# Patient Record
Sex: Female | Born: 1985 | Hispanic: Yes | Marital: Married | Smoking: Never smoker
Health system: Southern US, Community
[De-identification: ages and names within clinical notes are randomized; demographics above are authoritative.]

---

## 2004-12-22 ENCOUNTER — Emergency Department: Payer: Self-pay | Admitting: Emergency Medicine

## 2004-12-30 ENCOUNTER — Emergency Department: Payer: Self-pay | Admitting: Emergency Medicine

## 2005-06-15 ENCOUNTER — Observation Stay: Payer: Self-pay | Admitting: Obstetrics & Gynecology

## 2012-06-14 ENCOUNTER — Emergency Department: Payer: Self-pay | Admitting: Emergency Medicine

## 2016-07-29 ENCOUNTER — Ambulatory Visit
Admission: RE | Admit: 2016-07-29 | Discharge: 2016-07-29 | Disposition: A | Discharge status: Home / Self Care | Payer: Self-pay | Source: Ambulatory Visit | Attending: Nurse Practitioner | Admitting: Nurse Practitioner

## 2016-07-29 ENCOUNTER — Other Ambulatory Visit: Payer: Self-pay | Admitting: Nurse Practitioner

## 2016-07-29 ENCOUNTER — Other Ambulatory Visit: Payer: Self-pay

## 2016-07-29 DIAGNOSIS — Z3481 Encounter for supervision of other normal pregnancy, first trimester: Secondary | ICD-10-CM

## 2016-08-03 ENCOUNTER — Emergency Department: Payer: Self-pay

## 2016-08-03 ENCOUNTER — Emergency Department
Admission: EM | Admit: 2016-08-03 | Discharge: 2016-08-04 | Disposition: A | Discharge status: Home / Self Care | Payer: Self-pay | Attending: Emergency Medicine | Admitting: Emergency Medicine

## 2016-08-03 DIAGNOSIS — Z3A12 12 weeks gestation of pregnancy: Secondary | ICD-10-CM | POA: Insufficient documentation

## 2016-08-03 DIAGNOSIS — Z79899 Other long term (current) drug therapy: Secondary | ICD-10-CM | POA: Insufficient documentation

## 2016-08-03 DIAGNOSIS — O4692 Antepartum hemorrhage, unspecified, second trimester: Secondary | ICD-10-CM

## 2016-08-03 DIAGNOSIS — N9489 Other specified conditions associated with female genital organs and menstrual cycle: Secondary | ICD-10-CM | POA: Insufficient documentation

## 2016-08-03 DIAGNOSIS — O209 Hemorrhage in early pregnancy, unspecified: Secondary | ICD-10-CM | POA: Insufficient documentation

## 2016-08-03 LAB — URINALYSIS, COMPLETE (UACMP) WITH MICROSCOPIC
BILIRUBIN URINE: NEGATIVE
GLUCOSE, UA: NEGATIVE mg/dL
HGB URINE DIPSTICK: NEGATIVE
Ketones, ur: NEGATIVE mg/dL
LEUKOCYTES UA: NEGATIVE
NITRITE: POSITIVE — AB
PH: 7 (ref 5.0–8.0)
Protein, ur: NEGATIVE mg/dL
Specific Gravity, Urine: 1.006 (ref 1.005–1.030)

## 2016-08-03 LAB — CBC
HEMATOCRIT: 35.5 % (ref 35.0–47.0)
HEMOGLOBIN: 12.3 g/dL (ref 12.0–16.0)
MCH: 29.1 pg (ref 26.0–34.0)
MCHC: 34.7 g/dL (ref 32.0–36.0)
MCV: 83.9 fL (ref 80.0–100.0)
Platelets: 196 10*3/uL (ref 150–440)
RBC: 4.23 MIL/uL (ref 3.80–5.20)
RDW: 14.7 % — ABNORMAL HIGH (ref 11.5–14.5)
WBC: 9.7 10*3/uL (ref 3.6–11.0)

## 2016-08-03 LAB — COMPREHENSIVE METABOLIC PANEL
ALK PHOS: 73 U/L (ref 38–126)
ALT: 16 U/L (ref 14–54)
AST: 29 U/L (ref 15–41)
Albumin: 3.7 g/dL (ref 3.5–5.0)
Anion gap: 8 (ref 5–15)
BILIRUBIN TOTAL: 0.4 mg/dL (ref 0.3–1.2)
BUN: 7 mg/dL (ref 6–20)
CALCIUM: 9.2 mg/dL (ref 8.9–10.3)
CO2: 24 mmol/L (ref 22–32)
CREATININE: 0.59 mg/dL (ref 0.44–1.00)
Chloride: 103 mmol/L (ref 101–111)
GFR calc non Af Amer: >60 mL/min (ref >60)
GLUCOSE: 96 mg/dL (ref 65–99)
Potassium: 3.7 mmol/L (ref 3.5–5.1)
SODIUM: 135 mmol/L (ref 135–145)
TOTAL PROTEIN: 7.7 g/dL (ref 6.5–8.1)

## 2016-08-03 LAB — ABO/RH: ABO/RH(D): O POS

## 2016-08-03 LAB — HCG, QUANTITATIVE, PREGNANCY: HCG, BETA CHAIN, QUANT, S: 61680 m[IU]/mL — AB (ref <5)

## 2016-08-03 MED ORDER — SODIUM CHLORIDE 0.9 % IV BOLUS (SEPSIS)
1000.0000 mL | Freq: Once | INTRAVENOUS | Status: AC
  Administered 2016-08-04: 1000 mL via INTRAVENOUS

## 2016-08-03 NOTE — Discharge Instructions (Signed)
Please follow up closely with obstetrics and gynecology or health department doctor.  Return to the emergency room if your bleeding worsens, you become weak and dizzy or lightheaded, you have a fever or severe pain in the back or abdomen, you have an episode of passing out, develop severe bleeding such as more than 1 soaked pad per hour for more than 3 straight hours, develop abdominal or pelvic pain, fevers chills or other new concerns arise.

## 2016-08-03 NOTE — ED Notes (Signed)
Pelvic cramping intermittently since she found out that she was pregnant. Radiating to both sides of lower back. Pt reports scant vaginal bleeding today. Pt alert and oriented X4, active, cooperative, pt in NAD. RR even and unlabored, color WNL.

## 2016-08-03 NOTE — ED Triage Notes (Signed)
Pt is 12-[redacted] weeks pregnant and vomiting every day - today she started with vaginal bleeding (spotting) and lower abd pain that radiates into back

## 2016-08-03 NOTE — ED Notes (Signed)
Pt in ultrasound

## 2016-08-04 MED ORDER — DOXYLAMINE-PYRIDOXINE 10-10 MG PO TBEC: 2.0000 | DELAYED_RELEASE_TABLET | Freq: Every day | ORAL | 0 refills | Status: AC

## 2016-08-04 NOTE — ED Provider Notes (Signed)
[facility] Emergency Department Provider Note   ____________________________________________   First MD Initiated Contact with Patient 08/03/16 2324     (approximate)  I have reviewed the triage vital signs and the nursing notes.   HISTORY  Chief Complaint Vaginal Bleeding    HPI Diana Woodward is a 31 y.o. female here for evaluation of intermittent crampy lower abdominal pain beginning early this afternoon.  Patient reports she is about 12-[redacted] weeks pregnant, had an ultrasound a week ago by [facility] and was told to put normal, and follows with the local health department  This evening she began having intermittent crampy lower abdominal discomfort, which has now gone away, but noticed a small amount of vaginal bleeding associated. No other discharge or pain. She has had a slight increased frequency of urination over the last week, and was given a prescription for Macrobid to pick up today, but she has not yet completed her started this course.  She does report daily nausea and vomiting which has been true throughout the course of her pregnancy, currently on prescription Phenergan.  Today she continued to have her normal vomiting this morning, but had slightly more than normal amount throughout the rest of the day.  No fevers. No chills. No persistent pain in the lower pelvis or abdomen. She has been pregnant 3 times previous, with one C-section. No complications. No fertility factors. No history of ectopic  History reviewed. No pertinent past medical history.  There are no active problems to display for this patient.   History reviewed. No pertinent surgical history.  Prior to Admission medications   Medication Sig Start Date End Date Taking? Authorizing Provider  Doxylamine-Pyridoxine (DICLEGIS) 10-10 MG TBEC Take 2 tablets by mouth at bedtime. 08/04/16   Sharyn Creamer, MD    Allergies Patient has no known allergies.  No family history on  file.  Social History Social History  Substance Use Topics  . Smoking status: Never Smoker  . Smokeless tobacco: Never Used  . Alcohol use No    Review of Systems Constitutional: No fever/chills Eyes: No visual changes. ENT: No sore throat. Cardiovascular: Denies chest pain. Respiratory: Denies shortness of breath. Gastrointestinal: No abdominal pain but reports crampy discomfort in the lower pelvis midline, and a slight radiation into her lower back off and on since this afternoon.    No diarrhea.  No constipation. Genitourinary: Negative for dysuria. Has had increased urinary frequency over the last week and a half, reports the health department told her to decrease caffeine use, and also gave her a prescription for an antibiotic which she has not yet started today Musculoskeletal: Negative for back pain. Skin: Negative for rash. Neurological: Negative for headaches, focal weakness or numbness.  10-point ROS otherwise negative.  ____________________________________________   PHYSICAL EXAM:  VITAL SIGNS: ED Triage Vitals  Enc Vitals Group     BP 08/03/16 2128 121/73     Pulse Rate 08/03/16 2128 85     Resp 08/03/16 2128 15     Temp 08/03/16 2128 97 F (36.1 C)     Temp Source 08/03/16 2128 Oral     SpO2 08/03/16 2128 98 %     Weight 08/03/16 2129 167 lb (75.8 kg)     Height 08/03/16 2129 5\' 4"  (1.626 m)     Head Circumference --      Peak Flow --      Pain Score 08/03/16 2129 5     Pain Loc --  Pain Edu? --      Excl. in GC? --    Spanish interpreter utilized throughout Training and development officer(Hiram) gathering clinical history and examination  Constitutional: Alert and oriented. Well appearing and in no acute distress. Patient and her friend who accompanied her both extremely pleasant. Eyes: Conjunctivae are normal. PERRL. EOMI. Head: Atraumatic. Nose: No congestion/rhinnorhea. Mouth/Throat: Mucous membranes are moist.  Oropharynx non-erythematous. Neck: No stridor.    Cardiovascular: Normal rate, regular rhythm. Grossly normal heart sounds.  Good peripheral circulation. Respiratory: Normal respiratory effort.  No retractions. Lungs CTAB. Gastrointestinal: Soft and nontender. No distention. No abdominal bruits. No CVA tenderness. No pain on palpation over the pelvic or suprapubic region at this time. Of note the patient reports her symptoms seemed to have gone away, no further bleeding. Does feel gravid, uterine fundus palpated 4-5 cm above the pelvic brim. No focal pain in the right lower quadrant. Negative Murphy Musculoskeletal: No lower extremity tenderness nor edema.   Neurologic:  Normal speech and language. No gross focal neurologic deficits are appreciated. Skin:  Skin is warm, dry and intact. No rash noted. Psychiatric: Mood and affect are normal. Speech and behavior are normal.  ____________________________________________   LABS (all labs ordered are listed, but only abnormal results are displayed)  Labs Reviewed  URINALYSIS, COMPLETE (UACMP) WITH MICROSCOPIC - Abnormal; Notable for the following:       Result Value   Color, Urine YELLOW (*)    APPearance CLOUDY (*)    Nitrite POSITIVE (*)    Bacteria, UA MANY (*)    Squamous Epithelial / LPF 6-30 (*)    All other components within normal limits  HCG, QUANTITATIVE, PREGNANCY - Abnormal; Notable for the following:    hCG, Beta Chain, Quant, S 61,680 (*)    All other components within normal limits  CBC - Abnormal; Notable for the following:    RDW 14.7 (*)    All other components within normal limits  URINE CULTURE  COMPREHENSIVE METABOLIC PANEL  ABO/RH   ____________________________________________  EKG   ____________________________________________  RADIOLOGY  [place] Ob Comp Less 14 Wks  Result Date: 08/03/2016 CLINICAL DATA:  Light vaginal bleeding for 1 day. Positive urine pregnancy test. Estimated gestational age by LMP is 14 weeks 0 days. EXAM: OBSTETRIC <14 WK ULTRASOUND  TECHNIQUE: Transabdominal ultrasound was performed for evaluation of the gestation as well as the maternal uterus and adnexal regions. COMPARISON:  None. FINDINGS: Intrauterine gestational sac: A single intrauterine pregnancy is identified. Yolk sac: Yolk sac is not identified consistent with gestational age. Embryo:  Fetal pole is present.  Fetal motion is observed. Cardiac Activity: Fetal cardiac activity is observed. Heart Rate: 163 bpm CRL:   65.6  mm   12 w 6 d                  US EDC: 02/09/2017 Subchorionic hemorrhage:  None visualized. Maternal uterus/adnexae: Limited visualization of the uterus. Ovaries are not visualized. No free fluid in the pelvis. IMPRESSION: Single intrauterine pregnancy. Estimated gestational age by crown-rump length is 12 weeks 6 days. No acute complication is identified sonographically. Electronically Signed   By: Burman NievesWilliam  Stevens M.D.   On: 08/03/2016 23:29    ____________________________________________   PROCEDURES  Procedure(s) performed: None  Procedures  Critical Care performed: No  ____________________________________________   INITIAL IMPRESSION / ASSESSMENT AND PLAN / ED COURSE  Pertinent labs & imaging results that were available during my care of the patient were reviewed by me and considered in  my medical decision making (see chart for details).  Patient presents for crampy lower abdominal discomfort and small amount of vaginal bleeding. Hemodynamically stable. Known pregnancy, just entering the second trimester. No associated risk factors for ectopic, clinically very reassuring examination with resolution of pain and symptomatology at this time. She is on Phenergan, and reports persistent daily nausea and vomiting that appears most consistent with morning sickness. I did discuss, and she is amenable to also trying to Diclegis which I will prescribe a short course for.  Her ultrasound is reassuring, her clinical symptoms have improved. I did  advise her that we will culture her urine, but a would recommend she start the antibiotic for a possible urinary tract infection which her doctor has prescribed her. Patient is agreeable with this plan  Careful return precautions are discussed with the patient, also her friend, and she is agreeable. All questions, and follow-up plan as well as return precautions were discussed with the patient via use of Spanish interpreter.      ____________________________________________   FINAL CLINICAL IMPRESSION(S) / ED DIAGNOSES  Final diagnoses:  Vaginal bleeding in pregnancy, second trimester  pregnancy related nausea and vomiting Morning sickness    NEW MEDICATIONS STARTED DURING THIS VISIT:  New Prescriptions   DOXYLAMINE-PYRIDOXINE (DICLEGIS) 10-10 MG TBEC    Take 2 tablets by mouth at bedtime.     Note:  This document was prepared using Dragon voice recognition software and may include unintentional dictation errors.     Sharyn Creamer, MD 08/04/16 [phone]

## 2016-08-04 NOTE — ED Notes (Addendum)
Pt alert and oriented X4, active, cooperative, pt in NAD. RR even and unlabored, color WNL.  Pt informed to return if any life threatening symptoms occur.   [employer] interpreter used for discharge.  

## 2016-08-04 NOTE — ED Notes (Signed)
Pt awaiting IV fluids to be finished and will then be discharged to home.

## 2016-08-06 LAB — URINE CULTURE: SPECIAL REQUESTS: NORMAL

## 2017-09-18 ENCOUNTER — Encounter: Payer: Self-pay | Admitting: Emergency Medicine

## 2017-09-18 ENCOUNTER — Other Ambulatory Visit: Payer: Self-pay

## 2017-09-18 DIAGNOSIS — N12 Tubulo-interstitial nephritis, not specified as acute or chronic: Secondary | ICD-10-CM | POA: Insufficient documentation

## 2017-09-18 DIAGNOSIS — R109 Unspecified abdominal pain: Secondary | ICD-10-CM | POA: Insufficient documentation

## 2017-09-18 DIAGNOSIS — M549 Dorsalgia, unspecified: Secondary | ICD-10-CM | POA: Insufficient documentation

## 2017-09-18 DIAGNOSIS — R112 Nausea with vomiting, unspecified: Secondary | ICD-10-CM | POA: Insufficient documentation

## 2017-09-18 LAB — COMPREHENSIVE METABOLIC PANEL
ALT: 50 U/L (ref 14–54)
AST: 42 U/L — AB (ref 15–41)
Albumin: 4.3 g/dL (ref 3.5–5.0)
Alkaline Phosphatase: 101 U/L (ref 38–126)
Anion gap: 9 (ref 5–15)
BUN: 6 mg/dL (ref 6–20)
CHLORIDE: 105 mmol/L (ref 101–111)
CO2: 22 mmol/L (ref 22–32)
CREATININE: 0.49 mg/dL (ref 0.44–1.00)
Calcium: 8.9 mg/dL (ref 8.9–10.3)
GFR calc Af Amer: >60 mL/min (ref >60)
GFR calc non Af Amer: >60 mL/min (ref >60)
Glucose, Bld: 110 mg/dL — ABNORMAL HIGH (ref 65–99)
Potassium: 3.6 mmol/L (ref 3.5–5.1)
SODIUM: 136 mmol/L (ref 135–145)
Total Bilirubin: 0.7 mg/dL (ref 0.3–1.2)
Total Protein: 8.7 g/dL — ABNORMAL HIGH (ref 6.5–8.1)

## 2017-09-18 LAB — CBC
HCT: 39.7 % (ref 35.0–47.0)
Hemoglobin: 12.8 g/dL (ref 12.0–16.0)
MCH: 25.8 pg — AB (ref 26.0–34.0)
MCHC: 32.1 g/dL (ref 32.0–36.0)
MCV: 80.3 fL (ref 80.0–100.0)
PLATELETS: 160 10*3/uL (ref 150–440)
RBC: 4.95 MIL/uL (ref 3.80–5.20)
RDW: 16.7 % — AB (ref 11.5–14.5)
WBC: 13.5 10*3/uL — ABNORMAL HIGH (ref 3.6–11.0)

## 2017-09-18 LAB — LIPASE, BLOOD: LIPASE: 24 U/L (ref 11–51)

## 2017-09-18 NOTE — ED Notes (Signed)
Patient to stat desk asking about wait time. Patient given update on wait time. Patient verbalizes understanding. Patient in no acute distress at this time.  

## 2017-09-18 NOTE — ED Triage Notes (Signed)
Pt to ED via POV c/o RLQ abd pain and back pain that started last night. Pt states that the pain has gotten worse today. Pt is in NAD at this time. Pt denies fever. Pt states that she has been vomiting, has vomited 3 times in the last 24 hours.

## 2017-09-19 ENCOUNTER — Emergency Department: Payer: Self-pay

## 2017-09-19 ENCOUNTER — Emergency Department
Admission: EM | Admit: 2017-09-19 | Discharge: 2017-09-19 | Disposition: A | Discharge status: Home / Self Care | Payer: Self-pay | Attending: Emergency Medicine | Admitting: Emergency Medicine

## 2017-09-19 DIAGNOSIS — N12 Tubulo-interstitial nephritis, not specified as acute or chronic: Secondary | ICD-10-CM

## 2017-09-19 DIAGNOSIS — R109 Unspecified abdominal pain: Secondary | ICD-10-CM

## 2017-09-19 DIAGNOSIS — R112 Nausea with vomiting, unspecified: Secondary | ICD-10-CM

## 2017-09-19 LAB — URINALYSIS, COMPLETE (UACMP) WITH MICROSCOPIC
Bilirubin Urine: NEGATIVE
Glucose, UA: NEGATIVE mg/dL
KETONES UR: NEGATIVE mg/dL
Nitrite: POSITIVE — AB
PH: 6 (ref 5.0–8.0)
Protein, ur: 30 mg/dL — AB
SPECIFIC GRAVITY, URINE: 1.004 — AB (ref 1.005–1.030)

## 2017-09-19 LAB — POCT PREGNANCY, URINE: PREG TEST UR: NEGATIVE

## 2017-09-19 MED ORDER — MORPHINE SULFATE (PF) 4 MG/ML IV SOLN
4.0000 mg | Freq: Once | INTRAVENOUS | Status: AC
  Administered 2017-09-19: 4 mg via INTRAVENOUS
  Filled 2017-09-19: qty 1

## 2017-09-19 MED ORDER — ONDANSETRON HCL 4 MG/2ML IJ SOLN: 4.0000 mg | Freq: Once | INTRAMUSCULAR | Status: DC

## 2017-09-19 MED ORDER — ONDANSETRON HCL 4 MG/2ML IJ SOLN
4.0000 mg | Freq: Once | INTRAMUSCULAR | Status: AC
  Administered 2017-09-19: 4 mg via INTRAVENOUS

## 2017-09-19 MED ORDER — MORPHINE SULFATE (PF) 4 MG/ML IV SOLN
4.0000 mg | Freq: Once | INTRAVENOUS | Status: AC
  Administered 2017-09-19: 4 mg via INTRAVENOUS

## 2017-09-19 MED ORDER — MORPHINE SULFATE (PF) 4 MG/ML IV SOLN
INTRAVENOUS | Status: AC
  Administered 2017-09-19: 4 mg via INTRAVENOUS
  Filled 2017-09-19: qty 1

## 2017-09-19 MED ORDER — SODIUM CHLORIDE 0.9 % IV SOLN
1.0000 g | Freq: Once | INTRAVENOUS | Status: AC
  Administered 2017-09-19: 1 g via INTRAVENOUS
  Filled 2017-09-19: qty 10

## 2017-09-19 MED ORDER — OXYCODONE-ACETAMINOPHEN 7.5-325 MG PO TABS: 1.0000 | ORAL_TABLET | ORAL | 0 refills | Status: AC | PRN

## 2017-09-19 MED ORDER — MORPHINE SULFATE (PF) 4 MG/ML IV SOLN: 4.0000 mg | Freq: Once | INTRAVENOUS | Status: DC

## 2017-09-19 MED ORDER — ONDANSETRON HCL 4 MG/2ML IJ SOLN
INTRAMUSCULAR | Status: AC
  Administered 2017-09-19: 4 mg via INTRAVENOUS
  Filled 2017-09-19: qty 2

## 2017-09-19 MED ORDER — CEPHALEXIN 500 MG PO CAPS: 500.0000 mg | ORAL_CAPSULE | Freq: Four times a day (QID) | ORAL | 0 refills | Status: AC

## 2017-09-19 MED ORDER — SODIUM CHLORIDE 0.9 % IV BOLUS (SEPSIS)
1000.0000 mL | Freq: Once | INTRAVENOUS | Status: AC
  Administered 2017-09-19: 1000 mL via INTRAVENOUS

## 2017-09-19 MED ORDER — ONDANSETRON 4 MG PO TBDP: 4.0000 mg | ORAL_TABLET | Freq: Three times a day (TID) | ORAL | 0 refills | Status: AC | PRN

## 2017-09-19 NOTE — ED Notes (Signed)
Report to laurie, rn.  

## 2017-09-19 NOTE — ED Notes (Addendum)
Pt using call bell; wondering when the MD will be back in; pt's antibiotic still has over a half to infuse, pt keeps bending her arm; understands medication can't infuse that way; explained MD would be in to follow up when medicine is finished; pt tolerated po fluids given earlier without difficulty;

## 2017-09-19 NOTE — ED Notes (Signed)
Discharging pt home with [facility], [employer] interpreter [phone] [facility]

## 2017-09-19 NOTE — ED Provider Notes (Signed)
[facility] Emergency Department Provider Note   ____________________________________________   First MD Initiated Contact with Patient 09/19/17 0101     (approximate)  I have reviewed the triage vital signs and the nursing notes.   HISTORY  Chief Complaint Abdominal Pain and Back Pain    HPI Diana Woodward is a 32 y.o. female who comes into the hospital today with some abdomen pain and back pain.  The patient states that the pain started tonight.  She denies any pain with urination but has had some vomiting since about 3 PM.  The patient has not been able to eat anything since yesterday.  She is been taking Tylenol for the pain but states that it did not help.  The patient rates her pain currently a 10 out of 10 in intensity.  She states that the pain is more severe than when she had her last baby and had to get multiple epidurals.  The patient did not take her temperature but feels as though she had a fever.  She could not rest because she was so uncomfortable.  The pain is on the left side of her back and wraps around to her abdomen.  The patient states that she vomited about 4 times and it was mainly water.  She is here today for evaluation.   History reviewed. No pertinent past medical history.  There are no active problems to display for this patient.   Past Surgical History:  Procedure Laterality Date  . CESAREAN SECTION     x 2    Prior to Admission medications   Medication Sig Start Date End Date Taking? Authorizing Provider  cephALEXin (KEFLEX) 500 MG capsule Take 1 capsule (500 mg total) by mouth 4 (four) times daily for 10 days. 09/19/17 09/29/17  Rebecka Apley, MD  Doxylamine-Pyridoxine (DICLEGIS) 10-10 MG TBEC Take 2 tablets by mouth at bedtime. 08/04/16   Sharyn Creamer, MD  ondansetron (ZOFRAN ODT) 4 MG disintegrating tablet Take 1 tablet (4 mg total) by mouth every 8 (eight) hours as needed for nausea or vomiting. 09/19/17   Rebecka Apley, MD  oxyCODONE-acetaminophen (PERCOCET) 7.5-325 MG tablet Take 1 tablet by mouth every 4 (four) hours as needed for severe pain. 09/19/17 09/19/18  Rebecka Apley, MD    Allergies Patient has no known allergies.  No family history on file.  Social History Social History   Tobacco Use  . Smoking status: Never Smoker  . Smokeless tobacco: Never Used  Substance Use Topics  . Alcohol use: No  . Drug use: No    Review of Systems  Constitutional:  fever/chills Eyes: No visual changes. ENT: No sore throat. Cardiovascular: Denies chest pain. Respiratory: Denies shortness of breath. Gastrointestinal:  abdominal pain, nausea, vomiting.  No diarrhea.  No constipation. Genitourinary: Negative for dysuria. Musculoskeletal:  back pain. Skin: Negative for rash. Neurological: Negative for headaches, focal weakness or numbness.   ____________________________________________   PHYSICAL EXAM:  VITAL SIGNS: ED Triage Vitals  Enc Vitals Group     BP 09/18/17 1814 124/61     Pulse Rate 09/18/17 1814 99     Resp 09/18/17 1814 20     Temp 09/18/17 1814 98.2 F (36.8 C)     Temp Source 09/18/17 1814 Oral     SpO2 09/18/17 1814 97 %     Weight 09/18/17 1817 149 lb (67.6 kg)     Height --      Head Circumference --  Peak Flow --      Pain Score 09/18/17 1820 6     Pain Loc --      Pain Edu? --      Excl. in GC? --     Constitutional: Alert and oriented. Well appearing and in moderate to severe distress. Eyes: Conjunctivae are normal. PERRL. EOMI. Head: Atraumatic. Nose: No congestion/rhinnorhea. Mouth/Throat: Mucous membranes are moist.  Oropharynx non-erythematous. Cardiovascular: Normal rate, regular rhythm. Grossly normal heart sounds.  Good peripheral circulation. Respiratory: Normal respiratory effort.  No retractions. Lungs CTAB. Gastrointestinal: Soft with some left lower abdominal tenderness to palpation. No distention. Positive bowel sounds, with some  left CVA tenderness to palpation Musculoskeletal: No lower extremity tenderness nor edema.  Neurologic:  Normal speech and language.  Skin:  Skin is warm, dry and intact.  Psychiatric: Mood and affect are normal.   ____________________________________________   LABS (all labs ordered are listed, but only abnormal results are displayed)  Labs Reviewed  COMPREHENSIVE METABOLIC PANEL - Abnormal; Notable for the following components:      Result Value   Glucose, Bld 110 (*)    Total Protein 8.7 (*)    AST 42 (*)    All other components within normal limits  CBC - Abnormal; Notable for the following components:   WBC 13.5 (*)    MCH 25.8 (*)    RDW 16.7 (*)    All other components within normal limits  URINALYSIS, COMPLETE (UACMP) WITH MICROSCOPIC - Abnormal; Notable for the following components:   Color, Urine YELLOW (*)    APPearance CLOUDY (*)    Specific Gravity, Urine 1.004 (*)    Hgb urine dipstick MODERATE (*)    Protein, ur 30 (*)    Nitrite POSITIVE (*)    Leukocytes, UA LARGE (*)    Bacteria, UA RARE (*)    Squamous Epithelial / LPF 0-5 (*)    All other components within normal limits  CULTURE, BLOOD (ROUTINE X 2)  CULTURE, BLOOD (ROUTINE X 2)  LIPASE, BLOOD  POC URINE PREG, ED  POCT PREGNANCY, URINE   ____________________________________________  EKG  none ____________________________________________  RADIOLOGY  ED MD interpretation:  CT renal stone study: Findings consistent with cystitis and right pyelonephritis, no urolithiasis  Official radiology report(s): Ct Renal Stone Study  Result Date: 09/19/2017 CLINICAL DATA:  Flank pain.  Vomiting. EXAM: CT ABDOMEN AND PELVIS WITHOUT CONTRAST TECHNIQUE: Multidetector CT imaging of the abdomen and pelvis was performed following the standard protocol without IV contrast. COMPARISON:  None. FINDINGS: Lower chest: The lung bases are clear. Hepatobiliary: Liver is enlarged with hepatic steatosis. Minimal sparing  adjacent gallbladder fossa. Tiny 5 mm hypodensity in the right dome of the liver. Gallbladder physiologically distended, no calcified stone. No biliary dilatation. Pancreas: No ductal dilatation or inflammation. Spleen: Normal in size without focal abnormality. Adrenals/Urinary Tract: Normal adrenal glands. Mild right hydroureteronephrosis. No urolithiasis. There is mild right perinephric and periureteric edema. Punctate calcification in the right pelvis is posterior to the ureter and is likely a phlebolith. There is diffuse urinary bladder wall thickening. Mild perivesicular edema. No left hydronephrosis or hydroureter. No stone in either kidney. Stomach/Bowel: Stomach is within normal limits. Appendix appears normal. No evidence of bowel wall thickening, distention, or inflammatory changes. Moderate colonic stool with mild sigmoid tortuosity. Vascular/Lymphatic: Normal course and caliber of abdominal vessels. No enlarged abdominal or pelvic lymph nodes. Reproductive: Uterus and bilateral adnexa are unremarkable. Other: No free air, free fluid, or intra-abdominal fluid collection. Small  fat containing umbilical hernia. Musculoskeletal: There are no acute or suspicious osseous abnormalities. IMPRESSION: 1. Findings consistent with cystitis and right pyelonephritis. No urolithiasis. 2. Hepatic steatosis. Electronically Signed   By: Rubye Oaks M.D.   On: 09/19/2017 03:22    ____________________________________________   PROCEDURES  Procedure(s) performed: None  Procedures  Critical Care performed: No  ____________________________________________   INITIAL IMPRESSION / ASSESSMENT AND PLAN / ED COURSE  As part of my medical decision making, I reviewed the following data within the electronic MEDICAL RECORD NUMBER Notes from prior ED visits and [place] Controlled Substance Database   This is a 32 year old female who comes into the hospital today with some left-sided pain in her left abdomen and left  flank.  My differential diagnosis includes kidney stone, urine infection, musculoskeletal pain  I did give the patient a dose of morphine and Zofran.  I sent the patient for a CT scan looking for kidney stones.  The patient was still having pain so I gave her another dose of morphine and Zofran.  Her urinalysis did come back and it showed that the patient has a urinary tract infection with positive nitrites large leukocyte esterase and too numerous to count white blood cells.  The patient's white blood cell count is 13.5 but her CMP is negative.  I did order some ceftriaxone for the patient and she did receive Zofran.  After receiving her IV medication the patient was able to drink without any vomiting.  She states that her pain is improved but has not gone away completely.  I did explain to the patient that the pain would not go away completely.  The patient feels well enough to be discharged home.  She will be discharged to follow-up with the acute care clinic.  The patient should return with any worsening condition or any other concerns.      ____________________________________________   FINAL CLINICAL IMPRESSION(S) / ED DIAGNOSES  Final diagnoses:  Flank pain  Pyelonephritis  Non-intractable vomiting with nausea, unspecified vomiting type     ED Discharge Orders        Ordered    cephALEXin (KEFLEX) 500 MG capsule  4 times daily     09/19/17 0602    ondansetron (ZOFRAN ODT) 4 MG disintegrating tablet  Every 8 hours PRN     09/19/17 0602    oxyCODONE-acetaminophen (PERCOCET) 7.5-325 MG tablet  Every 4 hours PRN     09/19/17 0602       Note:  This document was prepared using Dragon voice recognition software and may include unintentional dictation errors.    Rebecka Apley, MD 09/19/17 [phone]

## 2017-09-19 NOTE — ED Notes (Signed)
Pt provided with water per dr. Zenda AlpersWebster for po challenge.

## 2017-09-19 NOTE — ED Notes (Signed)
Pt states onset of low back pain that radiates to llq 2 daya pta. Pt states she has had a fever, unknown level. Pt is thrashing around in pain on bed. Skin normal color, warm and dry. Pt denies vaginal discharge. Pt denies known hematuria. Pt moving all extremities without difficulty.

## 2017-09-19 NOTE — ED Notes (Signed)
Patient transported to CT 

## 2017-09-19 NOTE — ED Notes (Signed)
Dr Zenda AlpersWebster in to follow up

## 2017-09-19 NOTE — ED Notes (Signed)
Pt back from CT

## 2017-09-19 NOTE — ED Notes (Signed)
Temperature adjusted in room for pt comfort. Pt encouraged to leave arm straight to promote antibiotic administration.

## 2017-09-19 NOTE — ED Notes (Signed)
Family to stat desk asking about wait time. Patient given update on wait time. Patient verbalizes understanding.

## 2017-09-24 LAB — CULTURE, BLOOD (ROUTINE X 2)
Culture: NO GROWTH
Culture: NO GROWTH

## 2018-02-21 ENCOUNTER — Other Ambulatory Visit: Payer: Self-pay

## 2018-02-21 ENCOUNTER — Emergency Department
Admission: EM | Admit: 2018-02-21 | Discharge: 2018-02-21 | Disposition: A | Discharge status: Home / Self Care | Payer: Self-pay | Attending: Emergency Medicine | Admitting: Emergency Medicine

## 2018-02-21 DIAGNOSIS — H60502 Unspecified acute noninfective otitis externa, left ear: Secondary | ICD-10-CM | POA: Insufficient documentation

## 2018-02-21 DIAGNOSIS — Z79899 Other long term (current) drug therapy: Secondary | ICD-10-CM | POA: Insufficient documentation

## 2018-02-21 MED ORDER — CIPROFLOXACIN HCL 0.3 % OP SOLN: 2.0000 [drp] | OPHTHALMIC | 0 refills | Status: AC

## 2018-02-21 MED ORDER — CIPROFLOXACIN HCL 0.3 % OP SOLN
2.0000 [drp] | Freq: Once | OPHTHALMIC | Status: AC
  Administered 2018-02-21: 2 [drp] via OTIC
  Filled 2018-02-21: qty 2.5

## 2018-02-21 MED ORDER — ACETAMINOPHEN-CODEINE #3 300-30 MG PO TABS: 1.0000 | ORAL_TABLET | Freq: Three times a day (TID) | ORAL | 0 refills | Status: AC | PRN

## 2018-02-21 MED ORDER — HYDROCODONE-ACETAMINOPHEN 5-325 MG PO TABS
1.0000 | ORAL_TABLET | Freq: Once | ORAL | Status: AC
  Administered 2018-02-21: 1 via ORAL
  Filled 2018-02-21: qty 1

## 2018-02-21 NOTE — ED Notes (Signed)
See triage note  Presents with left ear pain and subjective fever for couple of days   Low grade fever on arrival

## 2018-02-21 NOTE — ED Triage Notes (Signed)
L earache x 3 days.  

## 2018-02-21 NOTE — Discharge Instructions (Signed)
You have an infection to the ear canal. Use the ear drops as directed. Take the pain medicine as needed. Do NOT drive after taking the pain medicine.  Avoid getting any water in the ear when showering. The wick will fall out of the ear when the swelling improves. Follow-up with Dr. Elenore RotaJuengel as needed.   Tiene una infeccin en el canal auditivo. Use las gotas para los odos segn las indicaciones. Tome el medicamento para el dolor segn sea necesario. NO maneje despus de tomar la medicina para [employer] dolor. Evite que entre agua en el odo cuando se bae. La mecha se caer del odo cuando mejore la hinchazn. Seguimiento con el Dr. Elenore RotaJuengel segn sea necesario.

## 2018-02-22 NOTE — ED Provider Notes (Signed)
[facility] Emergency Department Provider Note ____________________________________________  Time seen: [id number]  I have reviewed the triage vital signs and the nursing notes.  HISTORY  Chief Complaint  Otalgia  History limited by Spanish language.  Interpreter present for interview and exam.  HPI Diana Woodward is a 32 y.o. female presents to the ED accompanied by family, for evaluation of severe left ear pain.  Reports ear pain for the last 3 days but denies any otorrhea.  She does report some significant pain with movement of the earlobe as well as some decreased hearing.  She denies any water intrusion into the ear, barotrauma, or other injury.  Denies any nausea, vomiting, or dizziness not had any relief with over-the-counter pain medicines.  No past medical history on file.  There are no active problems to display for this patient.  Past Surgical History:  Procedure Laterality Date  . CESAREAN SECTION     x 2    Prior to Admission medications   Medication Sig Start Date End Date Taking? Authorizing Provider  acetaminophen-codeine (TYLENOL #3) 300-30 MG tablet Take 1 tablet by mouth every 8 (eight) hours as needed for up to 3 days for moderate pain. 02/21/18 02/24/18  Maribel Luis, Charlesetta IvoryJenise V Bacon, PA-C  ciprofloxacin (CILOXAN) 0.3 % ophthalmic solution Place 2 drops into the left ear every 4 (four) hours while awake for 7 days. 02/21/18 02/28/18  Shabree Tebbetts, Charlesetta IvoryJenise V Bacon, PA-C  Doxylamine-Pyridoxine (DICLEGIS) 10-10 MG TBEC Take 2 tablets by mouth at bedtime. 08/04/16   Sharyn CreamerQuale, Mark, MD  ondansetron (ZOFRAN ODT) 4 MG disintegrating tablet Take 1 tablet (4 mg total) by mouth every 8 (eight) hours as needed for nausea or vomiting. 09/19/17   Rebecka ApleyWebster, Allison P, MD  oxyCODONE-acetaminophen (PERCOCET) 7.5-325 MG tablet Take 1 tablet by mouth every 4 (four) hours as needed for severe pain. 09/19/17 09/19/18  Rebecka ApleyWebster, Allison P, MD    Allergies Patient has no known  allergies.  No family history on file.  Social History Social History   Tobacco Use  . Smoking status: Never Smoker  . Smokeless tobacco: Never Used  Substance Use Topics  . Alcohol use: No  . Drug use: No    Review of Systems  Constitutional: Negative for fever. Eyes: Negative for visual changes. ENT: Negative for sore throat.  Left otalgia as above. Cardiovascular: Negative for chest pain. Respiratory: Negative for shortness of breath. Gastrointestinal: Negative for abdominal pain, vomiting and diarrhea. Musculoskeletal: Negative for back pain. Skin: Negative for rash. Neurological: Negative for headaches, focal weakness or numbness. ____________________________________________  PHYSICAL EXAM:  VITAL SIGNS: ED Triage Vitals  Enc Vitals Group     BP 02/21/18 0907 132/83     Pulse Rate 02/21/18 0907 94     Resp 02/21/18 0907 18     Temp 02/21/18 0907 99.2 F (37.3 C)     Temp Source 02/21/18 0907 Oral     SpO2 02/21/18 0907 98 %     Weight 02/21/18 0908 190 lb (86.2 kg)     Height 02/21/18 0908 5\' 6"  (1.676 m)     Head Circumference --      Peak Flow --      Pain Score 02/21/18 0907 8     Pain Loc --      Pain Edu? --      Excl. in GC? --     Constitutional: Alert and oriented. Well appearing and in no distress. Head: Normocephalic and atraumatic. Eyes: Conjunctivae are normal.  PERRL. Normal extraocular movements Ears: Canal on the right is clear. TMs intact on the right. The left ear canal is edematous and has a purulent discharge. The tragus is tender and the TM is not visualized due to patient discomfort. Cardiovascular: Normal rate, regular rhythm. Normal distal pulses. Respiratory: Normal respiratory effort. No wheezes/rales/rhonchi. Musculoskeletal: Nontender with normal range of motion in all extremities.  Neurologic:  Normal gait without ataxia. Normal speech and language. No gross focal neurologic deficits are appreciated. Skin:  Skin is warm, dry  and intact. No rash noted. Psychiatric: Mood and affect are normal. Patient exhibits appropriate insight and judgment. ____________________________________________  PROCEDURES  Procedures  Norco 5-325 mgPO Ear wick placed in the left ear Ii gtts ciprofloxacin ophthalmic ointment to the left ear ____________________________________________  INITIAL IMPRESSION / ASSESSMENT AND PLAN / ED COURSE  Patient with ED evaluation of 3 days of severe left ear pain with relation of the tragus.  Patient's exam is concerning for an otitis externa.  Ear wick is placed and eardrops are instilled.  She is discharged with a prescription for Tylenol No. 3 for severe pain relief.  The ciprofloxacin ophthalmic ointment is dispensed here in the ED for use in the ear.  She will follow-up with ENT for ongoing management.  I reviewed the patient's prescription history over the last 12 months in the multi-state controlled substances database(s) that includes [place], [place], [place], [place], [place], [place], [place], [place], [place], [place], [place], [place], [place], and [place].  Results were notable for no current prescriptions.  ____________________________________________  FINAL CLINICAL IMPRESSION(S) / ED DIAGNOSES  Final diagnoses:  Acute otitis externa of left ear, unspecified type      Lissa Hoard, PA-C 02/22/18 1924    Myrna Blazer, MD 02/22/18 2204

## 2018-05-06 ENCOUNTER — Emergency Department
Admission: EM | Admit: 2018-05-06 | Discharge: 2018-05-06 | Disposition: A | Discharge status: Home / Self Care | Payer: Self-pay | Attending: Emergency Medicine | Admitting: Emergency Medicine

## 2018-05-06 DIAGNOSIS — H60333 Swimmer's ear, bilateral: Secondary | ICD-10-CM | POA: Insufficient documentation

## 2018-05-06 DIAGNOSIS — Z79899 Other long term (current) drug therapy: Secondary | ICD-10-CM | POA: Insufficient documentation

## 2018-05-06 MED ORDER — CIPROFLOXACIN-DEXAMETHASONE 0.3-0.1 % OT SUSP
4.0000 [drp] | Freq: Once | OTIC | Status: AC
  Administered 2018-05-06: 4 [drp] via OTIC
  Filled 2018-05-06: qty 7.5

## 2018-05-06 MED ORDER — KETOROLAC TROMETHAMINE 30 MG/ML IJ SOLN
30.0000 mg | Freq: Once | INTRAMUSCULAR | Status: AC
  Administered 2018-05-06: 30 mg via INTRAMUSCULAR
  Filled 2018-05-06: qty 1

## 2018-05-06 NOTE — ED Notes (Signed)
Called pharmacy they stated that they will send up medication

## 2018-05-06 NOTE — ED Triage Notes (Signed)
Pt presents today for Left ear pain that started  Medications taken: no  Decreased hearing   Pain started today at 3pm Pt states hard to hear in the ear.

## 2018-05-06 NOTE — ED Provider Notes (Signed)
[facility] Emergency Department Provider Note  ____________________________________________  Time seen: Approximately 11:00 PM  I have reviewed the triage vital signs and the nursing notes.   HISTORY  Chief Complaint No chief complaint on file.    HPI Diana Woodward is a 32 y.o. female presents to the emergency department with bilateral ear pain that is provoked when patient manipulates the tragus.  Patient has a prior history of bilateral otitis externa that has responded well in the past to Ciprodex.  She denies drainage from the ear or changes in hearing.  No fever chills at home.  No alleviating measures of been attempted.   History reviewed. No pertinent past medical history.  There are no active problems to display for this patient.   Past Surgical History:  Procedure Laterality Date  . CESAREAN SECTION     x 2    Prior to Admission medications   Medication Sig Start Date End Date Taking? Authorizing Provider  Doxylamine-Pyridoxine (DICLEGIS) 10-10 MG TBEC Take 2 tablets by mouth at bedtime. 08/04/16   Sharyn Creamer, MD  ondansetron (ZOFRAN ODT) 4 MG disintegrating tablet Take 1 tablet (4 mg total) by mouth every 8 (eight) hours as needed for nausea or vomiting. 09/19/17   Rebecka Apley, MD  oxyCODONE-acetaminophen (PERCOCET) 7.5-325 MG tablet Take 1 tablet by mouth every 4 (four) hours as needed for severe pain. 09/19/17 09/19/18  Rebecka Apley, MD    Allergies Patient has no known allergies.  No family history on file.  Social History Social History   Tobacco Use  . Smoking status: Never Smoker  . Smokeless tobacco: Never Used  Substance Use Topics  . Alcohol use: No  . Drug use: No     Review of Systems  Constitutional: No fever/chills Eyes: No visual changes. No discharge ENT: Patient has bilateral ear pain.  Cardiovascular: no chest pain. Respiratory: no cough. No SOB. Gastrointestinal: No abdominal pain.  No  nausea, no vomiting.  No diarrhea.  No constipation. Musculoskeletal: Negative for musculoskeletal pain. Skin: Negative for rash, abrasions, lacerations, ecchymosis. Neurological: Negative for headaches, focal weakness or numbness.   ____________________________________________   PHYSICAL EXAM:  VITAL SIGNS: ED Triage Vitals  Enc Vitals Group     BP 05/06/18 1949 130/85     Pulse Rate 05/06/18 1949 82     Resp 05/06/18 1949 16     Temp 05/06/18 1949 (!) 97.5 F (36.4 C)     Temp Source 05/06/18 1949 Oral     SpO2 05/06/18 1949 98 %     Weight --      Height --      Head Circumference --      Peak Flow --      Pain Score 05/06/18 2256 4     Pain Loc --      Pain Edu? --      Excl. in GC? --      Constitutional: Alert and oriented. Well appearing and in no acute distress. Eyes: Conjunctivae are normal. PERRL. EOMI. Head: Atraumatic. ENT:      Ears: Patient has swelling of the left external auditory canal.  Patient has tenderness with palpation of the tragus bilaterally.      Nose: No congestion/rhinnorhea.      Mouth/Throat: Mucous membranes are moist.  Neck: No stridor.  No cervical spine tenderness to palpation. Hematological/Lymphatic/Immunilogical: No cervical lymphadenopathy. Cardiovascular: Normal rate, regular rhythm. Normal S1 and S2.  Good peripheral circulation. Respiratory: Normal respiratory  effort without tachypnea or retractions. Lungs CTAB. Good air entry to the bases with no decreased or absent breath sounds. Gastrointestinal: Bowel sounds 4 quadrants. Soft and nontender to palpation. No guarding or rigidity. No palpable masses. No distention. No CVA tenderness. Musculoskeletal: Full range of motion to all extremities. No gross deformities appreciated. Neurologic:  Normal speech and language. No gross focal neurologic deficits are appreciated.  Skin:  Skin is warm, dry and intact. No rash noted. Psychiatric: Mood and affect are normal. Speech and behavior  are normal. Patient exhibits appropriate insight and judgement.   ____________________________________________   LABS (all labs ordered are listed, but only abnormal results are displayed)  Labs Reviewed - No data to display ____________________________________________  EKG   ____________________________________________  RADIOLOGY   No results found.  ____________________________________________    PROCEDURES  Procedure(s) performed:    Procedures    Medications  ketorolac (TORADOL) 30 MG/ML injection 30 mg (30 mg Intramuscular Given 05/06/18 2238)  ciprofloxacin-dexamethasone (CIPRODEX) 0.3-0.1 % OTIC (EAR) suspension 4 drop (4 drops Both EARS Given 05/06/18 2238)     ____________________________________________   INITIAL IMPRESSION / ASSESSMENT AND PLAN / ED COURSE  Pertinent labs & imaging results that were available during my care of the patient were reviewed by me and considered in my medical decision making (see chart for details).  Review of the [place] CSRS was performed in accordance of the [facility] prior to dispensing any controlled drugs.      Assessment and Plan:  Otitis externa Patient presents to the emergency department with bilateral ear pain that started earlier today.  Patient has a history of otitis externa.  On physical exam, patient has exquisite tenderness with palpation of the tragus bilaterally and swelling of the left external auditory canal.  Patient was treated with Ciprodex and advised to follow-up with primary care as needed.     ____________________________________________  FINAL CLINICAL IMPRESSION(S) / ED DIAGNOSES  Final diagnoses:  Acute swimmer's ear of both sides      NEW MEDICATIONS STARTED DURING THIS VISIT:  ED Discharge Orders    None          This chart was dictated using voice recognition software/Dragon. Despite best efforts to proofread, errors can occur which can change the meaning. Any change was purely  unintentional.    Orvil Feil, PA-C 05/06/18 2307    Myrna Blazer, MD 05/07/18 [phone]

## 2018-05-06 NOTE — ED Notes (Signed)
Pt states left ear pain with decreased hearing.

## 2018-09-08 ENCOUNTER — Encounter: Payer: Self-pay | Admitting: *Deleted

## 2018-09-08 ENCOUNTER — Emergency Department
Admission: EM | Admit: 2018-09-08 | Discharge: 2018-09-08 | Discharge status: Against Medical Advice | Payer: Self-pay | Attending: Emergency Medicine | Admitting: Emergency Medicine

## 2018-09-08 ENCOUNTER — Other Ambulatory Visit: Payer: Self-pay

## 2018-09-08 DIAGNOSIS — G43909 Migraine, unspecified, not intractable, without status migrainosus: Secondary | ICD-10-CM | POA: Insufficient documentation

## 2018-09-08 DIAGNOSIS — Z5321 Procedure and treatment not carried out due to patient leaving prior to being seen by health care provider: Secondary | ICD-10-CM | POA: Insufficient documentation

## 2018-09-08 NOTE — ED Triage Notes (Addendum)
Pt ambulatory to triage.  Pt has a headache and dizziness.   Vomited x 1.  States pain is in top of head.  Pt took a tylenol.  Pt alert  Speech clear.  Interpreter with pt.

## 2020-02-23 ENCOUNTER — Other Ambulatory Visit: Payer: Self-pay

## 2020-02-23 ENCOUNTER — Emergency Department: Payer: Self-pay

## 2020-02-23 ENCOUNTER — Encounter: Payer: Self-pay | Admitting: Emergency Medicine

## 2020-02-23 ENCOUNTER — Emergency Department
Admission: EM | Admit: 2020-02-23 | Discharge: 2020-02-23 | Disposition: A | Discharge status: Home / Self Care | Payer: Self-pay | Attending: Emergency Medicine | Admitting: Emergency Medicine

## 2020-02-23 DIAGNOSIS — R05 Cough: Secondary | ICD-10-CM | POA: Insufficient documentation

## 2020-02-23 DIAGNOSIS — Z20822 Contact with and (suspected) exposure to covid-19: Secondary | ICD-10-CM | POA: Insufficient documentation

## 2020-02-23 DIAGNOSIS — R519 Headache, unspecified: Secondary | ICD-10-CM | POA: Insufficient documentation

## 2020-02-23 DIAGNOSIS — R109 Unspecified abdominal pain: Secondary | ICD-10-CM | POA: Insufficient documentation

## 2020-02-23 DIAGNOSIS — R1110 Vomiting, unspecified: Secondary | ICD-10-CM | POA: Insufficient documentation

## 2020-02-23 LAB — URINALYSIS, COMPLETE (UACMP) WITH MICROSCOPIC
Bilirubin Urine: NEGATIVE
Glucose, UA: NEGATIVE mg/dL
Hgb urine dipstick: NEGATIVE
Ketones, ur: NEGATIVE mg/dL
Leukocytes,Ua: NEGATIVE
Nitrite: NEGATIVE
Protein, ur: NEGATIVE mg/dL
Specific Gravity, Urine: 1.009 (ref 1.005–1.030)
pH: 6 (ref 5.0–8.0)

## 2020-02-23 LAB — CBC
HCT: 38.9 % (ref 36.0–46.0)
Hemoglobin: 12.9 g/dL (ref 12.0–15.0)
MCH: 28.4 pg (ref 26.0–34.0)
MCHC: 33.2 g/dL (ref 30.0–36.0)
MCV: 85.7 fL (ref 80.0–100.0)
Platelets: 242 10*3/uL (ref 150–400)
RBC: 4.54 MIL/uL (ref 3.87–5.11)
RDW: 15 % (ref 11.5–15.5)
WBC: 9.9 10*3/uL (ref 4.0–10.5)
nRBC: 0 % (ref 0.0–0.2)

## 2020-02-23 LAB — COMPREHENSIVE METABOLIC PANEL
ALT: 45 U/L — ABNORMAL HIGH (ref 0–44)
AST: 38 U/L (ref 15–41)
Albumin: 4.2 g/dL (ref 3.5–5.0)
Alkaline Phosphatase: 100 U/L (ref 38–126)
Anion gap: 15 (ref 5–15)
BUN: 6 mg/dL (ref 6–20)
CO2: 20 mmol/L — ABNORMAL LOW (ref 22–32)
Calcium: 9.1 mg/dL (ref 8.9–10.3)
Chloride: 103 mmol/L (ref 98–111)
Creatinine, Ser: 0.62 mg/dL (ref 0.44–1.00)
GFR calc Af Amer: >60 mL/min (ref >60)
GFR calc non Af Amer: >60 mL/min (ref >60)
Glucose, Bld: 102 mg/dL — ABNORMAL HIGH (ref 70–99)
Potassium: 3.3 mmol/L — ABNORMAL LOW (ref 3.5–5.1)
Sodium: 138 mmol/L (ref 135–145)
Total Bilirubin: 0.4 mg/dL (ref 0.3–1.2)
Total Protein: 8.3 g/dL — ABNORMAL HIGH (ref 6.5–8.1)

## 2020-02-23 LAB — POC URINE PREG, ED: Preg Test, Ur: NEGATIVE

## 2020-02-23 LAB — LIPASE, BLOOD: Lipase: 24 U/L (ref 11–51)

## 2020-02-23 LAB — SARS CORONAVIRUS 2 BY RT PCR (HOSPITAL ORDER, PERFORMED IN ~~LOC~~ HOSPITAL LAB): SARS Coronavirus 2: NEGATIVE

## 2020-02-23 MED ORDER — DICYCLOMINE HCL 10 MG PO CAPS: 10.0000 mg | ORAL_CAPSULE | Freq: Three times a day (TID) | ORAL | 0 refills | Status: AC

## 2020-02-23 MED ORDER — DICYCLOMINE HCL 10 MG PO CAPS
10.0000 mg | ORAL_CAPSULE | Freq: Once | ORAL | Status: AC
  Administered 2020-02-23: 10 mg via ORAL
  Filled 2020-02-23: qty 1

## 2020-02-23 MED ORDER — KETOROLAC TROMETHAMINE 30 MG/ML IJ SOLN
30.0000 mg | Freq: Once | INTRAMUSCULAR | Status: AC
  Administered 2020-02-23: 30 mg via INTRAMUSCULAR
  Filled 2020-02-23: qty 1

## 2020-02-23 MED ORDER — KETOROLAC TROMETHAMINE 10 MG PO TABS: 10.0000 mg | ORAL_TABLET | Freq: Four times a day (QID) | ORAL | 0 refills | Status: AC | PRN

## 2020-02-23 NOTE — ED Triage Notes (Signed)
Here with vomiting, diarrhea, and cough for the past few days, has not been tested for covid. Lower to mid abd pain intermittently. NAD.

## 2020-02-23 NOTE — ED Provider Notes (Signed)
Emergency Department Provider Note  ____________________________________________  Time seen: Approximately 4:49 PM  I have reviewed the triage vital signs and the nursing notes.   HISTORY  Chief Complaint Diarrhea, Emesis, Cough, and Abdominal Pain   Historian Patient     HPI Diana Woodward is a 34 y.o. female presents to the emergency department with emesis, diarrhea, dry cough, headache and body aches for the past 2 to 3 days.  No recent travel.  No chest pain or chest tightness.  Patient is concern for COVID-19 infection.  No sick contacts in the home with similar symptoms.   History reviewed. No pertinent past medical history.   Immunizations up to date:  Yes.     History reviewed. No pertinent past medical history.  There are no problems to display for this patient.   Past Surgical History:  Procedure Laterality Date  . CESAREAN SECTION     x 2    Prior to Admission medications   Medication Sig Start Date End Date Taking? Authorizing Provider  dicyclomine (BENTYL) 10 MG capsule Take 1 capsule (10 mg total) by mouth 3 (three) times daily before meals for 14 days. 02/23/20 03/08/20  Orvil Feil, PA-C  Doxylamine-Pyridoxine (DICLEGIS) 10-10 MG TBEC Take 2 tablets by mouth at bedtime. 08/04/16   Sharyn Creamer, MD  ketorolac (TORADOL) 10 MG tablet Take 1 tablet (10 mg total) by mouth every 6 (six) hours as needed for up to 5 days. 02/23/20 02/28/20  Orvil Feil, PA-C  ondansetron (ZOFRAN ODT) 4 MG disintegrating tablet Take 1 tablet (4 mg total) by mouth every 8 (eight) hours as needed for nausea or vomiting. 09/19/17   Rebecka Apley, MD    Allergies Patient has no known allergies.  No family history on file.  Social History Social History   Tobacco Use  . Smoking status: Never Smoker  . Smokeless tobacco: Never Used  Substance Use Topics  . Alcohol use: No  . Drug use: No      Review of Systems  Constitutional: Patient has fever.   Eyes: No visual changes. No discharge ENT: Patient has congestion.  Cardiovascular: no chest pain. Respiratory: Patient has cough.  Gastrointestinal: No abdominal pain.  No nausea, no vomiting. Patient had diarrhea.  Genitourinary: Negative for dysuria. No hematuria Musculoskeletal: Patient has myalgias.  Skin: Negative for rash, abrasions, lacerations, ecchymosis. Neurological: Patient has headache, no focal weakness or numbness.       ____________________________________________   PHYSICAL EXAM:  VITAL SIGNS: ED Triage Vitals  Enc Vitals Group     BP 02/23/20 1549 106/84     Pulse Rate 02/23/20 1548 (!) 103     Resp 02/23/20 1548 16     Temp 02/23/20 1548 99.2 F (37.3 C)     Temp Source 02/23/20 1548 Oral     SpO2 02/23/20 1548 98 %     Weight 02/23/20 1551 160 lb (72.6 kg)     Height 02/23/20 1551 5\' 1"  (1.549 m)     Head Circumference --      Peak Flow --      Pain Score 02/23/20 1549 4     Pain Loc --      Pain Edu? --      Excl. in GC? --     Constitutional: Alert and oriented. Patient is lying supine. Eyes: Conjunctivae are normal. PERRL. EOMI. Head: Atraumatic. ENT:      Ears: Tympanic membranes are mildly injected with mild effusion bilaterally.  Nose: No congestion/rhinnorhea.      Mouth/Throat: Mucous membranes are moist. Posterior pharynx is mildly erythematous.  Hematological/Lymphatic/Immunilogical: No cervical lymphadenopathy.  Cardiovascular: Normal rate, regular rhythm. Normal S1 and S2.  Good peripheral circulation. Respiratory: Normal respiratory effort without tachypnea or retractions. Lungs CTAB. Good air entry to the bases with no decreased or absent breath sounds. Gastrointestinal: Bowel sounds 4 quadrants. Soft and nontender to palpation. No guarding or rigidity. No palpable masses. No distention. No CVA tenderness. Musculoskeletal: Full range of motion to all extremities. No gross deformities appreciated. Neurologic:  Normal speech  and language. No gross focal neurologic deficits are appreciated.  Skin:  Skin is warm, dry and intact. No rash noted. Psychiatric: Mood and affect are normal. Speech and behavior are normal. Patient exhibits appropriate insight and judgement.    ____________________________________________   LABS (all labs ordered are listed, but only abnormal results are displayed)  Labs Reviewed  COMPREHENSIVE METABOLIC PANEL - Abnormal; Notable for the following components:      Result Value   Potassium 3.3 (*)    CO2 20 (*)    Glucose, Bld 102 (*)    Total Protein 8.3 (*)    ALT 45 (*)    All other components within normal limits  URINALYSIS, COMPLETE (UACMP) WITH MICROSCOPIC - Abnormal; Notable for the following components:   Color, Urine YELLOW (*)    APPearance CLOUDY (*)    Bacteria, UA RARE (*)    All other components within normal limits  POC URINE PREG, ED - Normal  SARS CORONAVIRUS 2 BY RT PCR (HOSPITAL ORDER, PERFORMED IN [facility])  LIPASE, BLOOD  CBC   ____________________________________________  EKG   ____________________________________________  RADIOLOGY Geraldo Pitter, personally viewed and evaluated these images (plain radiographs) as part of my medical decision making, as well as reviewing the written report by the radiologist.    DG Chest 1 View  Result Date: 02/23/2020 CLINICAL DATA:  Dry cough EXAM: CHEST  1 VIEW COMPARISON:  None. FINDINGS: Heart is normal size. No confluent airspace opacities or effusions. Low lung volumes. No acute bony abnormality. IMPRESSION: Low lung volumes.  No acute cardiopulmonary disease. Electronically Signed   By: Charlett Nose M.D.   On: 02/23/2020 17:12    ____________________________________________    PROCEDURES  Procedure(s) performed:     Procedures     Medications  ketorolac (TORADOL) 30 MG/ML injection 30 mg (30 mg Intramuscular Given 02/23/20 1705)  dicyclomine (BENTYL) capsule 10 mg (10 mg  Oral Given 02/23/20 1704)     ____________________________________________   INITIAL IMPRESSION / ASSESSMENT AND PLAN / ED COURSE  Pertinent labs & imaging results that were available during my care of the patient were reviewed by me and considered in my medical decision making (see chart for details).      Assessment and Plan:  Headache Abdominal discomfort 34 year old female presents to the emergency department with headache, fever, cough and crampy abdominal pain.  Vital signs were reassuring at triage.  Differential diagnosis includes COVID-19, cystitis, viral infection....  COVID-19 testing was negative.  CBC and CMP were reassuring.  Urinalysis did not suggest cystitis.  Urine pregnancy testing was negative.  Patient reported that her discomfort resolved after Toradol and Bentyl were given.  Return precautions were given to return with new or worsening symptoms.   ____________________________________________  FINAL CLINICAL IMPRESSION(S) / ED DIAGNOSES  Final diagnoses:  Nonintractable headache, unspecified chronicity pattern, unspecified headache type  Abdominal discomfort  NEW MEDICATIONS STARTED DURING THIS VISIT:  ED Discharge Orders         Ordered    dicyclomine (BENTYL) 10 MG capsule  3 times daily before meals        02/23/20 1821    ketorolac (TORADOL) 10 MG tablet  Every 6 hours PRN        02/23/20 1821              This chart was dictated using voice recognition software/Dragon. Despite best efforts to proofread, errors can occur which can change the meaning. Any change was purely unintentional.     Orvil Feil, PA-C 02/23/20 2101    Shaune Pollack, MD 02/27/20 [phone]

## 2021-11-25 ENCOUNTER — Emergency Department: Payer: Self-pay

## 2021-11-25 ENCOUNTER — Ambulatory Visit: Payer: Self-pay | Admitting: *Deleted

## 2021-11-25 ENCOUNTER — Other Ambulatory Visit: Payer: Self-pay

## 2021-11-25 ENCOUNTER — Emergency Department
Admission: EM | Admit: 2021-11-25 | Discharge: 2021-11-25 | Disposition: A | Discharge status: Home / Self Care | Payer: Self-pay | Attending: Emergency Medicine | Admitting: Emergency Medicine

## 2021-11-25 DIAGNOSIS — R079 Chest pain, unspecified: Secondary | ICD-10-CM

## 2021-11-25 DIAGNOSIS — F321 Major depressive disorder, single episode, moderate: Secondary | ICD-10-CM

## 2021-11-25 DIAGNOSIS — F419 Anxiety disorder, unspecified: Secondary | ICD-10-CM | POA: Insufficient documentation

## 2021-11-25 DIAGNOSIS — R0789 Other chest pain: Secondary | ICD-10-CM | POA: Insufficient documentation

## 2021-11-25 LAB — BASIC METABOLIC PANEL
Anion gap: 8 (ref 5–15)
BUN: 6 mg/dL (ref 6–20)
CO2: 25 mmol/L (ref 22–32)
Calcium: 8.5 mg/dL — ABNORMAL LOW (ref 8.9–10.3)
Chloride: 103 mmol/L (ref 98–111)
Creatinine, Ser: 0.47 mg/dL (ref 0.44–1.00)
GFR, Estimated: >60 mL/min (ref >60)
Glucose, Bld: 136 mg/dL — ABNORMAL HIGH (ref 70–99)
Potassium: 3.3 mmol/L — ABNORMAL LOW (ref 3.5–5.1)
Sodium: 136 mmol/L (ref 135–145)

## 2021-11-25 LAB — CBC
HCT: 35.9 % — ABNORMAL LOW (ref 36.0–46.0)
Hemoglobin: 11.7 g/dL — ABNORMAL LOW (ref 12.0–15.0)
MCH: 29.5 pg (ref 26.0–34.0)
MCHC: 32.6 g/dL (ref 30.0–36.0)
MCV: 90.4 fL (ref 80.0–100.0)
Platelets: 191 10*3/uL (ref 150–400)
RBC: 3.97 MIL/uL (ref 3.87–5.11)
RDW: 16.1 % — ABNORMAL HIGH (ref 11.5–15.5)
WBC: 7.1 10*3/uL (ref 4.0–10.5)
nRBC: 0 % (ref 0.0–0.2)

## 2021-11-25 LAB — TROPONIN I (HIGH SENSITIVITY)
Troponin I (High Sensitivity): 7 ng/L (ref <18)
Troponin I (High Sensitivity): 8 ng/L (ref <18)

## 2021-11-25 LAB — D-DIMER, QUANTITATIVE: D-Dimer, Quant: <0.27 ug/mL-FEU (ref 0.00–0.50)

## 2021-11-25 MED ORDER — ACETAMINOPHEN 500 MG PO TABS
1000.0000 mg | ORAL_TABLET | Freq: Once | ORAL | Status: AC
  Administered 2021-11-25: 1000 mg via ORAL
  Filled 2021-11-25: qty 2

## 2021-11-25 MED ORDER — FAMOTIDINE 20 MG PO TABS: 20.0000 mg | ORAL_TABLET | Freq: Two times a day (BID) | ORAL | 0 refills | Status: AC

## 2021-11-25 MED ORDER — ESCITALOPRAM OXALATE 10 MG PO TABS: 10.0000 mg | ORAL_TABLET | Freq: Every day | ORAL | 0 refills | Status: AC

## 2021-11-25 MED ORDER — ALUM & MAG HYDROXIDE-SIMETH 200-200-20 MG/5ML PO SUSP
30.0000 mL | Freq: Once | ORAL | Status: AC
  Administered 2021-11-25: 30 mL via ORAL
  Filled 2021-11-25: qty 30

## 2021-11-25 MED ORDER — FAMOTIDINE 20 MG PO TABS
40.0000 mg | ORAL_TABLET | Freq: Once | ORAL | Status: AC
  Administered 2021-11-25: 40 mg via ORAL
  Filled 2021-11-25: qty 2

## 2021-11-25 NOTE — ED Provider Notes (Signed)
[facility] Provider Note    Event Date/Time   First MD Initiated Contact with Patient 11/25/21 0820     (approximate)   History   Chief Complaint: Chest Pain   HPI  Spanish interpreter used throughout encounter  Diana Woodward is a 36 y.o. female  with no significant past medical history who comes to the ED today complaining of central chest pain which is diffuse, dull, radiating to the neck, started at 1:00 AM, off and on, worse with deep breathing.  No alleviating factors.  Currently mild.  No exertional symptoms.  She reports being normal at bedtime last night.  She does note that she has had symptoms like this in the past whenever she is feeling stressed, and she does note feeling very stressed recently.  This is attributable to taking care of her children, their father not being part of their daily lives currently, and feeling like she has to be "the mom and the dad."  With further questioning, the patient notes that she has had depressed mood, anhedonia, lack of volition, feelings of hopelessness and guilt, decreased energy level, interrupted sleep, poor appetite.  Denies SI or HI.  She denies having a primary care doctor.  Takes no medications.  She notes the symptoms have been going on for at least a month.   I reviewed outside records, DC summary from [facility] hospital from 02/11/2017 shows pt was admitted for post-dates induction of labor, resulting in C section delivery, no in-hospital complications.       Physical Exam   Triage Vital Signs: ED Triage Vitals  Enc Vitals Group     BP 11/25/21 0811 (!) 142/104     Pulse Rate 11/25/21 0811 (!) 110     Resp 11/25/21 0811 16     Temp 11/25/21 0811 98.5 F (36.9 C)     Temp Source 11/25/21 0811 Oral     SpO2 11/25/21 0811 97 %     Weight 11/25/21 0814 160 lb 0.9 oz (72.6 kg)     Height 11/25/21 0814 5\' 1"  (1.549 m)     Head Circumference --      Peak Flow --      Pain Score 11/25/21 0814  5     Pain Loc --      Pain Edu? --      Excl. in [place]? --     Most recent vital signs: Vitals:   11/25/21 0811 11/25/21 0937  BP: (!) 142/104 (!) 137/96  Pulse: (!) 110 91  Resp: 16 11  Temp: 98.5 F (36.9 C)   SpO2: 97% 100%    General: Awake, no distress.  CV:  Good peripheral perfusion.  Tachycardia heart rate 110.  Normal heart sounds.  Equal peripheral pulses. Resp:  Normal effort.  Clear to auscultation bilaterally.  No wheezes or crackles.  No tachypnea. Abd:  No distention.  Soft and nontender Other:  No lower extremity edema or calf tenderness.  Symmetric calf circumference.  Moist oral mucosa.   ED Results / Procedures / Treatments   Labs (all labs ordered are listed, but only abnormal results are displayed) Labs Reviewed  BASIC METABOLIC PANEL - Abnormal; Notable for the following components:      Result Value   Potassium 3.3 (*)    Glucose, Bld 136 (*)    Calcium 8.5 (*)    All other components within normal limits  CBC - Abnormal; Notable for the following components:   Hemoglobin  11.7 (*)    HCT 35.9 (*)    RDW 16.1 (*)    All other components within normal limits  D-DIMER, QUANTITATIVE  POC URINE PREG, ED  TROPONIN I (HIGH SENSITIVITY)  TROPONIN I (HIGH SENSITIVITY)     EKG Interpreted by me Sinus tachycardia, rate 112.  Normal axis and intervals.  Normal QRS ST segments and T waves.  No ischemic changes or evidence of right heart strain.   RADIOLOGY Chest x-ray viewed and interpreted by me, appears normal.  Radiology report reviewed.   PROCEDURES:  Procedures   MEDICATIONS ORDERED IN ED: Medications  alum & mag hydroxide-simeth (MAALOX/MYLANTA) 200-200-20 MG/5ML suspension 30 mL (30 mLs Oral Given 11/25/21 0930)  acetaminophen (TYLENOL) tablet 1,000 mg (1,000 mg Oral Given 11/25/21 0930)  famotidine (PEPCID) tablet 40 mg (40 mg Oral Given 11/25/21 0930)     IMPRESSION / MDM / ASSESSMENT AND PLAN / ED COURSE  I reviewed the triage vital  signs and the nursing notes.                              Differential diagnosis includes, but is not limited to, GERD, stress reaction, major depression, non-STEMI, pulmonary embolism, pneumothorax, pneumonia  Patient's presentation is most consistent with acute complicated illness / injury requiring diagnostic workup.  Patient presents with atypical chest pain, most likely GERD related.  Initial chest x-ray and serum labs are normal.  I will check a D-dimer for PE screening which I think is unlikely.  Most likely symptoms are also related to her symptoms of depression.  She does not have currently established primary care, so I will start her on an SSRI and refer to outpatient follow-up.  She is very low risk for any harm to self or others.   ----------------------------------------- 10:20 AM on 11/25/2021 ----------------------------------------- Serial troponin and D-dimer are all normal.  Patient reports that she feels better after antacid medicines.  Heart rate is 90.  Work-up is unremarkable and reassuring.  She does not require admission due to her improvement and normal diagnostics.  I will start her on Lexapro, Pepcid, follow-up with primary care.      FINAL CLINICAL IMPRESSION(S) / ED DIAGNOSES   Final diagnoses:  Nonspecific chest pain  Current moderate episode of major depressive disorder without prior episode (HCC)     Rx / DC Orders   ED Discharge Orders          Ordered    famotidine (PEPCID) 20 MG tablet  2 times daily        11/25/21 1019    escitalopram (LEXAPRO) 10 MG tablet  Daily        11/25/21 1019             Note:  This document was prepared using Dragon voice recognition software and may include unintentional dictation errors.   Sharman Cheek, MD 11/25/21 1020

## 2021-11-25 NOTE — ED Triage Notes (Signed)
Pt here with cp that started last night with some SOB. Pt also c/o left arm pain. Pt states pain is centered and is severe. Pt denies diarrhea but endorses N/V.

## 2021-11-25 NOTE — Telephone Encounter (Signed)
  Chief Complaint: depression, looking for PCP Symptoms: depressed Frequency: misses work Pertinent Negatives: Patient denies idea of harming self or others Disposition: [] ED /[] Urgent Care (no appt availability in office) / [x] Appointment(In office/virtual)/ []  [facility]/ [] Home Care/ [] Refused Recommended Disposition /[] [facility]/ []  Follow-up with PCP Additional Notes: Pt calling from community line. Able to get her appt with new PCP, Dr. in [place] at [facility].  Reason for Disposition  [1] Started on anti-depressant medications < 2 weeks ago AND [2] not feeling any better  Answer Assessment - Initial Assessment Questions 1. CONCERN: "What happened that made you call today?"     Today ED for depression and they told her to call here 2. DEPRESSION SYMPTOM SCREENING: "How are you feeling overall?" (e.g., decreased energy, increased sleeping or difficulty sleeping, difficulty concentrating, feelings of sadness, guilt, hopelessness, or worthlessness)     Caring for children alone, youngest is 5.    Under a lot of stress, can not go to work. 3. RISK OF HARM - SUICIDAL IDEATION:  "Do you ever have thoughts of hurting or killing yourself?"  (e.g., yes, no, no but preoccupation with thoughts about death)   - INTENT:  "Do you have thoughts of hurting or killing yourself right NOW?" (e.g., yes, no, N/A)   - PLAN: "Do you have a specific plan for how you would do this?" (e.g., gun, knife, overdose, no plan, N/A)     no 4. RISK OF HARM - HOMICIDAL IDEATION:  "Do you ever have thoughts of hurting or killing someone else?"  (e.g., yes, no, no but preoccupation with thoughts about death)   - INTENT:  "Do you have thoughts of hurting or killing someone right NOW?" (e.g., yes, no, N/A)   - PLAN: "Do you have a specific plan for how you would do this?" (e.g., gun, knife, no plan, N/A)      no 5. FUNCTIONAL IMPAIRMENT: "How have things been  going for you overall? Have you had more difficulty than usual doing your normal daily activities?"  (e.g., better, same, worse; self-care, school, work, interactions)     Not able to go to work some days 6. SUPPORT: "Who is with you now?" "Who do you live with?" "Do you have family or friends who you can talk to?"      children 7. THERAPIST: "Do you have a counselor or therapist? Name?"     no 8. STRESSORS: "Has there been any new stress or recent changes in your life?"     Children, work, general mental health. 9. ALCOHOL USE OR SUBSTANCE USE (DRUG USE): "Do you drink alcohol or use any illegal drugs?"     no 10. OTHER: "Do you have any other physical symptoms right now?" (e.g., fever)       No, just tired 11. PREGNANCY: "Is there any chance you are pregnant?" "When was your last menstrual period?"       no  Protocols used: Depression-A-AH

## 2021-12-04 ENCOUNTER — Encounter: Payer: Self-pay | Admitting: Nurse Practitioner

## 2021-12-04 ENCOUNTER — Ambulatory Visit (INDEPENDENT_AMBULATORY_CARE_PROVIDER_SITE_OTHER): Payer: Self-pay | Admitting: Nurse Practitioner

## 2021-12-04 VITALS — BP 120/79 | HR 110 | Wt 201.3 lb

## 2021-12-04 DIAGNOSIS — F32A Depression, unspecified: Secondary | ICD-10-CM

## 2021-12-04 DIAGNOSIS — Z596 Low income: Secondary | ICD-10-CM

## 2021-12-04 DIAGNOSIS — Z7689 Persons encountering health services in other specified circumstances: Secondary | ICD-10-CM | POA: Insufficient documentation

## 2021-12-04 NOTE — Progress Notes (Signed)
Established Patient Office Visit  Subjective:  Patient ID: Diana Woodward, female    DOB: 12/25/1985  Age: 36 y.o. MRN: [id number]  CC: No chief complaint on file.    HPI  Spanish interpreter used throughout the encounter.   Diana Woodward presents for establishing care. She went to ER on 11/25/2021 with complaint of chest pain  and was diagnosed for moderate depression without prior episodes and was started on escitalopram 10 mg once daily. Patient reports that she is stressed because she does not have a job and is  not able to pay the rent for the house and the landlord wants her to vacant the house. She is scared of being homeless with her four kids. The father of the kids is in [place] and she has to deal with this all by her self.   She also reports sleeping problem, depressed mood, fatigued, poor appetite and hopelessness. The patient reports that she is pretending to be strong in front of the kids but internally she is very stressed out. She denies SI or HI. She is interested in getting  therapy for depression.  Depression        This is a new problem.  The current episode started 1 to 4 weeks ago.   The onset quality is gradual.   The problem occurs daily.The problem is unchanged.  Associated symptoms include decreased concentration, fatigue, hopelessness, insomnia, decreased interest and appetite change.  Associated symptoms include no myalgias, no headaches and no suicidal ideas.     The symptoms are aggravated by medication.  Past treatments include SSRIs - Selective serotonin reuptake inhibitors.  Compliance with treatment is variable.  Previous treatment provided mild relief.  Risk factors include stress.     History reviewed. No pertinent past medical history.  Past Surgical History:  Procedure Laterality Date   CESAREAN SECTION     x 2    History reviewed. No pertinent family history.  Social History   Socioeconomic History   Marital status: Married     Spouse name: Not on file   Number of children: Not on file   Years of education: Not on file   Highest education level: Not on file  Occupational History   Not on file  Tobacco Use   Smoking status: Never   Smokeless tobacco: Never  Substance and Sexual Activity   Alcohol use: No   Drug use: No   Sexual activity: Not on file  Other Topics Concern   Not on file  Social History Narrative   Not on file   Social Determinants of Health   Financial Resource Strain: Not on file  Food Insecurity: Not on file  Transportation Needs: Not on file  Physical Activity: Not on file  Stress: Not on file  Social Connections: Not on file  Intimate Partner Violence: Not on file     Outpatient Medications Prior to Visit  Medication Sig Dispense Refill   dicyclomine (BENTYL) 10 MG capsule Take 1 capsule (10 mg total) by mouth 3 (three) times daily before meals for 14 days. 56 capsule 0   Doxylamine-Pyridoxine (DICLEGIS) 10-10 MG TBEC Take 2 tablets by mouth at bedtime. 28 tablet 0   escitalopram (LEXAPRO) 10 MG tablet Take 1 tablet (10 mg total) by mouth daily. 90 tablet 0   famotidine (PEPCID) 20 MG tablet Take 1 tablet (20 mg total) by mouth 2 (two) times daily. 60 tablet 0   ondansetron (ZOFRAN ODT) 4 MG disintegrating tablet  Take 1 tablet (4 mg total) by mouth every 8 (eight) hours as needed for nausea or vomiting. 20 tablet 0   No facility-administered medications prior to visit.    No Known Allergies  ROS Review of Systems  Constitutional:  Positive for appetite change and fatigue. Negative for activity change.  HENT:  Negative for congestion, ear discharge and postnasal drip.   Eyes:  Negative for pain, discharge and visual disturbance.  Respiratory:  Negative for apnea, cough and wheezing.   Cardiovascular:  Negative for chest pain and palpitations.  Gastrointestinal:  Negative for abdominal distention, anal bleeding and diarrhea.  Genitourinary:  Negative for decreased urine  volume, difficulty urinating, flank pain and hematuria.  Musculoskeletal:  Negative for arthralgias, back pain and myalgias.  Skin:  Negative for color change, pallor and rash.  Neurological:  Negative for dizziness, facial asymmetry and headaches.  Psychiatric/Behavioral:  Positive for decreased concentration and depression. Negative for agitation, behavioral problems, confusion and suicidal ideas. The patient has insomnia.       Objective:    Physical Exam Constitutional:      Appearance: Normal appearance. She is obese.  HENT:     Head: Normocephalic and atraumatic.     Right Ear: Tympanic membrane normal.     Left Ear: Tympanic membrane normal.     Nose: Nose normal.     Mouth/Throat:     Mouth: Mucous membranes are moist.     Pharynx: Oropharynx is clear.  Eyes:     Extraocular Movements: Extraocular movements intact.     Conjunctiva/sclera: Conjunctivae normal.     Pupils: Pupils are equal, round, and reactive to light.  Cardiovascular:     Rate and Rhythm: Regular rhythm. Tachycardia present.     Pulses: Normal pulses.     Heart sounds: Normal heart sounds.  Pulmonary:     Effort: Pulmonary effort is normal.     Breath sounds: Normal breath sounds.  Abdominal:     General: Bowel sounds are normal.     Palpations: Abdomen is soft.  Musculoskeletal:        General: Normal range of motion.     Cervical back: Normal range of motion.  Skin:    General: Skin is warm.     Capillary Refill: Capillary refill takes less than 2 seconds.  Neurological:     General: No focal deficit present.     Mental Status: She is alert and oriented to person, place, and time. Mental status is at baseline.  Psychiatric:        Mood and Affect: Mood normal.        Behavior: Behavior normal.        Thought Content: Thought content normal.        Judgment: Judgment normal.     BP 120/79   Pulse (!) 110   Wt 201 lb 5 oz (91.3 kg)   LMP 11/24/2021 (Exact Date)   BMI 38.04 kg/m  Wt  Readings from Last 3 Encounters:  12/04/21 201 lb 5 oz (91.3 kg)  11/25/21 160 lb 0.9 oz (72.6 kg)  02/23/20 160 lb (72.6 kg)     Health Maintenance Due  Topic Date Due   COVID-19 Vaccine (1) Never done   HIV Screening  Never done   Hepatitis C Screening  Never done   TETANUS/TDAP  Never done   PAP SMEAR-Modifier  Never done    There are no preventive care reminders to display for this patient.  No results  found for: "TSH" Lab Results  Component Value Date   WBC 7.1 11/25/2021   HGB 11.7 (L) 11/25/2021   HCT 35.9 (L) 11/25/2021   MCV 90.4 11/25/2021   PLT 191 11/25/2021   Lab Results  Component Value Date   NA 136 11/25/2021   K 3.3 (L) 11/25/2021   CO2 25 11/25/2021   GLUCOSE 136 (H) 11/25/2021   BUN 6 11/25/2021   CREATININE 0.47 11/25/2021   BILITOT 0.4 02/23/2020   ALKPHOS 100 02/23/2020   AST 38 02/23/2020   ALT 45 (H) 02/23/2020   PROT 8.3 (H) 02/23/2020   ALBUMIN 4.2 02/23/2020   CALCIUM 8.5 (L) 11/25/2021   ANIONGAP 8 11/25/2021   No results found for: "CHOL" No results found for: "HDL" No results found for: "LDLCALC" No results found for: "TRIG" No results found for: "CHOLHDL" No results found for: "HGBA1C"    Assessment & Plan:   Problem List Items Addressed This Visit       Other   Encounter to establish care with new doctor - Primary    Care established. Advised her to consume healthy diet and regular exercise routine.       Depression    Continue the current medication. Would send referral for therapy.       Relevant Orders   Ambulatory referral to Psychology   Patient not earning income    Patient worried of being homeless with her four kids.       Relevant Orders   Ambulatory referral to Social Work     No orders of the defined types were placed in this encounter.    Follow-up: No follow-ups on file.    Kara Dies, NP

## 2021-12-05 ENCOUNTER — Emergency Department
Admission: EM | Admit: 2021-12-05 | Discharge: 2021-12-05 | Disposition: A | Discharge status: Home / Self Care | Payer: Self-pay | Attending: Emergency Medicine | Admitting: Emergency Medicine

## 2021-12-05 ENCOUNTER — Other Ambulatory Visit: Payer: Self-pay

## 2021-12-05 ENCOUNTER — Emergency Department: Payer: Self-pay

## 2021-12-05 DIAGNOSIS — R053 Chronic cough: Secondary | ICD-10-CM | POA: Insufficient documentation

## 2021-12-05 DIAGNOSIS — F32A Depression, unspecified: Secondary | ICD-10-CM | POA: Insufficient documentation

## 2021-12-05 DIAGNOSIS — R040 Epistaxis: Secondary | ICD-10-CM | POA: Insufficient documentation

## 2021-12-05 DIAGNOSIS — Z596 Low income: Secondary | ICD-10-CM | POA: Insufficient documentation

## 2021-12-05 DIAGNOSIS — J3489 Other specified disorders of nose and nasal sinuses: Secondary | ICD-10-CM | POA: Insufficient documentation

## 2021-12-05 DIAGNOSIS — R0789 Other chest pain: Secondary | ICD-10-CM | POA: Insufficient documentation

## 2021-12-05 LAB — CBC
HCT: 36 % (ref 36.0–46.0)
Hemoglobin: 11.6 g/dL — ABNORMAL LOW (ref 12.0–15.0)
MCH: 29.3 pg (ref 26.0–34.0)
MCHC: 32.2 g/dL (ref 30.0–36.0)
MCV: 90.9 fL (ref 80.0–100.0)
Platelets: 240 10*3/uL (ref 150–400)
RBC: 3.96 MIL/uL (ref 3.87–5.11)
RDW: 16.5 % — ABNORMAL HIGH (ref 11.5–15.5)
WBC: 11.2 10*3/uL — ABNORMAL HIGH (ref 4.0–10.5)
nRBC: 0 % (ref 0.0–0.2)

## 2021-12-05 LAB — BASIC METABOLIC PANEL
Anion gap: 10 (ref 5–15)
BUN: <5 mg/dL — ABNORMAL LOW (ref 6–20)
CO2: 23 mmol/L (ref 22–32)
Calcium: 8.3 mg/dL — ABNORMAL LOW (ref 8.9–10.3)
Chloride: 102 mmol/L (ref 98–111)
Creatinine, Ser: 0.45 mg/dL (ref 0.44–1.00)
GFR, Estimated: >60 mL/min (ref >60)
Glucose, Bld: 109 mg/dL — ABNORMAL HIGH (ref 70–99)
Potassium: 2.6 mmol/L — CL (ref 3.5–5.1)
Sodium: 135 mmol/L (ref 135–145)

## 2021-12-05 LAB — TROPONIN I (HIGH SENSITIVITY): Troponin I (High Sensitivity): 10 ng/L (ref <18)

## 2021-12-05 MED ORDER — BENZONATATE 100 MG PO CAPS
200.0000 mg | ORAL_CAPSULE | Freq: Once | ORAL | Status: AC
  Administered 2021-12-05: 200 mg via ORAL
  Filled 2021-12-05: qty 2

## 2021-12-05 MED ORDER — BENZONATATE 100 MG PO CAPS
100.0000 mg | ORAL_CAPSULE | Freq: Three times a day (TID) | ORAL | 0 refills | Status: AC | PRN
  Filled 2021-12-05 – 2021-12-08 (×2): qty 30, 10d supply, fill #0

## 2021-12-05 MED ORDER — LORAZEPAM 0.5 MG PO TABS
0.5000 mg | ORAL_TABLET | Freq: Once | ORAL | Status: AC
  Administered 2021-12-05: 0.5 mg via ORAL
  Filled 2021-12-05: qty 1

## 2021-12-05 MED ORDER — HYDROXYZINE PAMOATE 100 MG PO CAPS
100.0000 mg | ORAL_CAPSULE | Freq: Three times a day (TID) | ORAL | 0 refills | Status: AC | PRN
  Filled 2021-12-05 – 2021-12-08 (×2): qty 30, 10d supply, fill #0

## 2021-12-05 MED ORDER — ACETAMINOPHEN 325 MG PO TABS
650.0000 mg | ORAL_TABLET | Freq: Once | ORAL | Status: AC
  Administered 2021-12-05: 650 mg via ORAL
  Filled 2021-12-05: qty 2

## 2021-12-05 MED ORDER — LORAZEPAM 1 MG PO TABS: 1.0000 mg | ORAL_TABLET | Freq: Once | ORAL | Status: DC

## 2021-12-05 MED ORDER — HYDROXYZINE HCL 25 MG PO TABS
50.0000 mg | ORAL_TABLET | Freq: Once | ORAL | Status: AC
  Administered 2021-12-05: 50 mg via ORAL
  Filled 2021-12-05: qty 2

## 2021-12-05 MED ORDER — POTASSIUM CHLORIDE 20 MEQ PO PACK
60.0000 meq | PACK | Freq: Once | ORAL | Status: AC
  Administered 2021-12-05: 60 meq via ORAL
  Filled 2021-12-05: qty 3

## 2021-12-05 NOTE — Discharge Instructions (Signed)
Please use a pot of boiling water in the corner of your room with the door/windows closed, and the air conditioning off every night until your cough improves.  Please use a small amount of Vaseline on the inside of your nose prior to going to sleep to prevent any further nosebleeding

## 2021-12-05 NOTE — Assessment & Plan Note (Signed)
Care established. Advised her to consume healthy diet and regular exercise routine.

## 2021-12-05 NOTE — Assessment & Plan Note (Addendum)
Continue the current medication. Would send referral for therapy.

## 2021-12-05 NOTE — ED Provider Notes (Signed)
[facility] Provider Note   Event Date/Time   First MD Initiated Contact with Patient 12/05/21 1727     (approximate) History  Chest Pain and Shortness of Breath  HPI Diana Woodward is a 36 y.o. female  Location: General chest Duration: 2 weeks prior to arrival Timing: Since onset Severity: 5/10 Quality: Aching Context: Patient states that she has had a chronic cough over the last 2 weeks that is now causing her chest pain especially when coughing. Modifying factors: Coughing or taking a deep breath worsens this pain and is partially relieved at rest Associated Symptoms: Chronic dry cough, ROS: Patient currently denies any vision changes, tinnitus, difficulty speaking, facial droop, sore throat, chest pain, shortness of breath, abdominal pain, nausea/vomiting/diarrhea, dysuria, or weakness/numbness/paresthesias in any extremity   Physical Exam  Triage Vital Signs: ED Triage Vitals  Enc Vitals Group     BP 12/05/21 1610 (!) 152/105     Pulse Rate 12/05/21 1610 (!) 124     Resp 12/05/21 1610 18     Temp 12/05/21 1610 98.7 F (37.1 C)     Temp Source 12/05/21 1610 Oral     SpO2 12/05/21 1610 92 %     Weight 12/05/21 1611 201 lb 5 oz (91.3 kg)     Height 12/05/21 1611 5\' 1"  (1.549 m)     Head Circumference --      Peak Flow --      Pain Score 12/05/21 1611 5     Pain Loc --      Pain Edu? --      Excl. in [place]? --    Most recent vital signs: Vitals:   12/05/21 1900 12/05/21 1945  BP: 117/81   Pulse: (!) 101   Resp:  (!) 23  Temp:    SpO2: 96%    General: Awake, oriented x4. CV:  Good peripheral perfusion.  Resp:  Normal effort.  Abd:  No distention. Other:  Middle-aged Hispanic female laying in bed in no distress ED Results / Procedures / Treatments  Labs (all labs ordered are listed, but only abnormal results are displayed) Labs Reviewed  BASIC METABOLIC PANEL - Abnormal; Notable for the following components:      Result Value    Potassium 2.6 (*)    Glucose, Bld 109 (*)    BUN <5 (*)    Calcium 8.3 (*)    All other components within normal limits  CBC - Abnormal; Notable for the following components:   WBC 11.2 (*)    Hemoglobin 11.6 (*)    RDW 16.5 (*)    All other components within normal limits  POC URINE PREG, ED  TROPONIN I (HIGH SENSITIVITY)   EKG ED ECG REPORT I, Naaman Plummer, the attending physician, personally viewed and interpreted this ECG. Date: 12/05/2021 EKG Time: 1617 Rate: 123 Rhythm: Tachycardic sinus rhythm QRS Axis: normal Intervals: normal ST/T Wave abnormalities: normal Narrative Interpretation: Tachycardic sinus rhythm.  No evidence of acute ischemia RADIOLOGY ED MD interpretation: 2 view chest x-ray interpreted by me shows no evidence of acute abnormalities including no pneumonia, pneumothorax, or widened mediastinum -Agree with radiology assessment Official radiology report(s): DG Chest 2 View  Result Date: 12/05/2021 CLINICAL DATA:  Chest pain, cough, insomnia, anxiety EXAM: CHEST - 2 VIEW COMPARISON:  11/25/2021 FINDINGS: Normal heart size, mediastinal contours, and pulmonary vascularity. Lungs clear. No pulmonary infiltrate, pleural effusion or pneumothorax. Osseous structures unremarkable. IMPRESSION: No acute abnormalities. Electronically Signed  By: Lavonia Dana M.D.   On: 12/05/2021 16:31   PROCEDURES: Critical Care performed: No .1-3 Lead EKG Interpretation  Performed by: Naaman Plummer, MD Authorized by: Naaman Plummer, MD     Interpretation: normal     ECG rate:  97   ECG rate assessment: normal     Rhythm: sinus rhythm     Ectopy: none     Conduction: normal    MEDICATIONS ORDERED IN ED: Medications  potassium chloride (KLOR-CON) packet 60 mEq (60 mEq Oral Given 12/05/21 1740)  LORazepam (ATIVAN) tablet 0.5 mg (0.5 mg Oral Given 12/05/21 1816)  acetaminophen (TYLENOL) tablet 650 mg (650 mg Oral Given 12/05/21 1951)  benzonatate (TESSALON) capsule 200 mg (200 mg  Oral Given 12/05/21 1952)  hydrOXYzine (ATARAX) tablet 50 mg (50 mg Oral Given 12/05/21 1951)   IMPRESSION / MDM / ASSESSMENT AND PLAN / ED COURSE  I reviewed the triage vital signs and the nursing notes.                             The patient is on the cardiac monitor to evaluate for evidence of arrhythmia and/or significant heart rate changes. Patient's presentation is most consistent with acute presentation with potential threat to life or bodily function. Workup: ECG, CXR, CBC, BMP, Troponin Findings: ECG: No overt evidence of STEMI. No evidence of Brugadas sign, delta wave, epsilon wave, significantly prolonged QTc, or malignant arrhythmia HS Troponin: Negative x1 Other Labs unremarkable for emergent problems. CXR: Without PTX, PNA, or widened mediastinum Last Stress Test: Never Last Heart Catheterization: Never HEART Score: 1  Given History, Exam, and Workup I have low suspicion for ACS, Pneumothorax, Pneumonia, Pulmonary Embolus, Tamponade, Aortic Dissection or other emergent problem as a cause for this presentation.  Given consolation of symptoms as well as recurrent nosebleed that was found on subsequent questioning, patient likely is suffering from chronic dry throat as well as his environment while she sleeps.  Patient was encouraged to use a humidifier as well as Vaseline cream in her nose prior to sleeping.  Patient also given prescription for Tessalon Perles for her chronic cough Reassesment: Prior to discharge patients pain was controlled and they were well appearing.  Disposition:  Discharge. Strict return precautions discussed with patient with full understanding. Advised patient to follow up promptly with primary care provider  FINAL CLINICAL IMPRESSION(S) / ED DIAGNOSES   Final diagnoses:  Chest wall pain  Chronic cough  Epistaxis  Nose dryness   Rx / DC Orders   ED Discharge Orders          Ordered    benzonatate (TESSALON PERLES) 100 MG capsule  3 times daily  PRN        12/05/21 1934    hydrOXYzine (VISTARIL) 100 MG capsule  3 times daily PRN        12/05/21 1934           Note:  This document was prepared using Dragon voice recognition software and may include unintentional dictation errors.   Naaman Plummer, MD 12/05/21 2116

## 2021-12-05 NOTE — ED Triage Notes (Signed)
Pt to ED via POV from home. Pt reports CP, cough and insomnia due to anxiety. Pt seen on Monday for the same. Pt reports symptoms have not improved

## 2021-12-05 NOTE — Assessment & Plan Note (Addendum)
Patient worried of being homeless with her four kids.

## 2021-12-08 ENCOUNTER — Other Ambulatory Visit: Payer: Self-pay

## 2021-12-08 ENCOUNTER — Ambulatory Visit: Payer: Self-pay | Admitting: *Deleted

## 2021-12-08 NOTE — Telephone Encounter (Addendum)
Via interpreter [phone] Chief Complaint: Depression Symptoms: crying, can not leave house Frequency: all the time Pertinent Negatives: Patient denies SI and HI Disposition: [x] ED /[] Urgent Care (no appt availability in office) / [] Appointment(In office/virtual)/ []  [facility]/ [] Home Care/ [] Refused Recommended Disposition /[] [facility]/ []  Follow-up with PCP Additional Notes: Pt has an appt Thursday, was seen in ED 3 days ago. Did not want to stay, Son taking her to ED today at [facility]. Wants medication changes to help her get to Thursday appt.  Reason for Disposition  [1] Depression AND [2] unable to do any of normal activities (e.g., self care, school, work; in comparison to baseline).  Answer Assessment - Initial Assessment Questions 1. CONCERN: "What happened that made you call today?"     Depression worsening 2. DEPRESSION SYMPTOM SCREENING: "How are you feeling overall?" (e.g., decreased energy, increased sleeping or difficulty sleeping, difficulty concentrating, feelings of sadness, guilt, hopelessness, or worthlessness)     Crying, depressed, can not go out 3. RISK OF HARM - SUICIDAL IDEATION:  "Do you ever have thoughts of hurting or killing yourself?"  (e.g., yes, no, no but preoccupation with thoughts about death)   - INTENT:  "Do you have thoughts of hurting or killing yourself right NOW?" (e.g., yes, no, N/A)   - PLAN: "Do you have a specific plan for how you would do this?" (e.g., gun, knife, overdose, no plan, N/A)     No SI ideation 4. RISK OF HARM - HOMICIDAL IDEATION:  "Do you ever have thoughts of hurting or killing someone else?"  (e.g., yes, no, no but preoccupation with thoughts about death)   - INTENT:  "Do you have thoughts of hurting or killing someone right NOW?" (e.g., yes, no, N/A)   - PLAN: "Do you have a specific plan for how you would do this?" (e.g., gun, knife, no plan, N/A)      No HI ideation 5. FUNCTIONAL IMPAIRMENT: "How have things  been going for you overall? Have you had more difficulty than usual doing your normal daily activities?"  (e.g., better, same, worse; self-care, school, work, )     Lost job because of depression 6. SUPPORT: "Who is with you now?" "Who do you live with?" "Do you have family or friends who you can talk to?"      *No Answer* 7. THERAPIST: "Do you have a counselor or therapist? Name?"     *No Answer* 8. STRESSORS: "Has there been any new stress or recent changes in your life?"     *No Answer* 9. ALCOHOL USE OR SUBSTANCE USE (DRUG USE): "Do you drink alcohol or use any illegal drugs?"     *No Answer* 10. OTHER: "Do you have any other physical symptoms right now?" (e.g., fever)       *No Answer* 11. PREGNANCY: "Is there any chance you are pregnant?" "When was your last menstrual period?"       No  Protocols used: Depression-A-AH

## 2021-12-11 ENCOUNTER — Ambulatory Visit: Payer: Self-pay | Admitting: Nurse Practitioner

## 2021-12-15 ENCOUNTER — Telehealth: Payer: Self-pay | Admitting: Licensed Clinical Social Worker

## 2021-12-15 NOTE — Telephone Encounter (Signed)
Pt. LVM for LCSW. LCSW returned call with Quinisha Yemen, Interpreter. Patient requesting a letter for her landlord regarding treatment. LCSW informed patient that she cannot do that because she has not seen her and if she needs an appointment card with the appt. Time she can get that from [facility].

## 2021-12-18 ENCOUNTER — Ambulatory Visit: Payer: Self-pay | Admitting: Licensed Clinical Social Worker

## 2021-12-21 ENCOUNTER — Other Ambulatory Visit: Payer: Self-pay

## 2021-12-23 ENCOUNTER — Ambulatory Visit: Payer: Self-pay | Admitting: Student

## 2021-12-23 NOTE — Progress Notes (Deleted)
   Subjective:  Patient ID: Diana Woodward, female    DOB: 1985-12-05, 36 y.o.   MRN: [id number]  CC: New Patient  HPI:  Diana Woodward is a very pleasant 36 y.o. female who presents today to establish care.   Depression: Symptoms: Age of onset of first mood disturbance: Duration of CURRENT symptoms: Impact on function:  Psychiatric History - Diagnoses: - Hospitalizations:   - Pharmacotherapy - Outpatient therapy:  Family history of psychiatric issues: Current and history of substance use: Medical conditions that might explain or contribute to symptoms:  PHQ-9:     12/04/2021   12:29 PM  PHQ9 SCORE ONLY  PHQ-9 Total Score 23    GAD7:      No data to display            PMHx: No past medical history on file.  Surgical Hx: Past Surgical History:  Procedure Laterality Date   CESAREAN SECTION     x 2    Family Hx: No family history on file.  Social Hx: Current Social History   (Please include date ( .td) when updating information )  Who lives at home: *** 12/23/2021  Who would speak for you about health care matters: *** 12/23/2021  Transportation: *** 12/23/2021 Important Relationships & Pets: *** 12/23/2021  Current Stressors: *** 12/23/2021 Work / Education:  *** 12/23/2021 Religious / Personal Beliefs: *** 12/23/2021 Interests / Fun: *** 12/23/2021 Other: *** 12/23/2021   Medications:   ROS: Woman:  Patient reports no  vision/ hearing changes,anorexia, weight change, fever ,adenopathy, persistant / recurrent hoarseness, swallowing issues, chest pain, edema,persistant / recurrent cough, hemoptysis, dyspnea(rest, exertional, paroxysmal nocturnal), gastrointestinal  bleeding (melena, rectal bleeding), abdominal pain, excessive heart burn, GU symptoms(dysuria, hematuria, pyuria, voiding/incontinence  Issues) syncope, focal weakness, severe memory loss, concerning skin lesions, depression, anxiety, abnormal bruising/bleeding, major joint  swelling, breast masses or abnormal vaginal bleeding.    Man:  Patient reports no  vision/ hearing changes,anorexia, weight change, fever ,adenopathy, persistant / recurrent hoarseness, swallowing issues, chest pain, edema,persistant / recurrent cough, hemoptysis, dyspnea(rest, exertional, paroxysmal nocturnal), gastrointestinal  bleeding (melena, rectal bleeding), abdominal pain, excessive heart burn, GU symptoms(dysuria, hematuria, pyuria, voiding/incontinence  Issues) syncope, focal weakness, severe memory loss, concerning skin lesions, depression, anxiety, abnormal bruising/bleeding, major joint swelling.    Preventative Screening Pap test: year*** results *** HIV Hep C TDAP   Smoking status reviewed  ROS: pertinent noted in the HPI    Objective:  LMP 11/24/2021 (Exact Date)  Vitals and nursing note reviewed  General: NAD, pleasant, able to participate in exam HEENT: normocephalic, TM's visualized bilaterally, no scleral icterus or conjunctival pallor, no nasal discharge, moist mucous membranes, good dentition without erythema or discharge noted in posterior oropharynx Neck: supple, non-tender, without lymphadenopathy Cardiac: RRR, S1 S2 present. normal heart sounds, no murmurs. Respiratory: CTAB, normal effort, No wheezes, rales or rhonchi Abdomen: Normoactive bowel sounds, non-tender, non-distended, no hepatosplenomegaly Extremities: no edema or cyanosis. Skin: warm and dry, no rashes noted Neuro: alert, no obvious focal deficits Psych: Normal affect and mood  Assessment & Plan:  No problem-specific Assessment & Plan notes found for this encounter.   No orders of the defined types were placed in this encounter.  No orders of the defined types were placed in this encounter.  No follow-ups on file. Aanyah Loa Geophysical data processor

## 2023-08-27 IMAGING — CR DG CHEST 2V
2 series · 2 of 2 positions shown · non-contrast
Comparison: 11/25/2021

CLINICAL DATA: Chest pain, cough, insomnia, anxiety

EXAM:
CHEST - 2 VIEW

[chest pa]
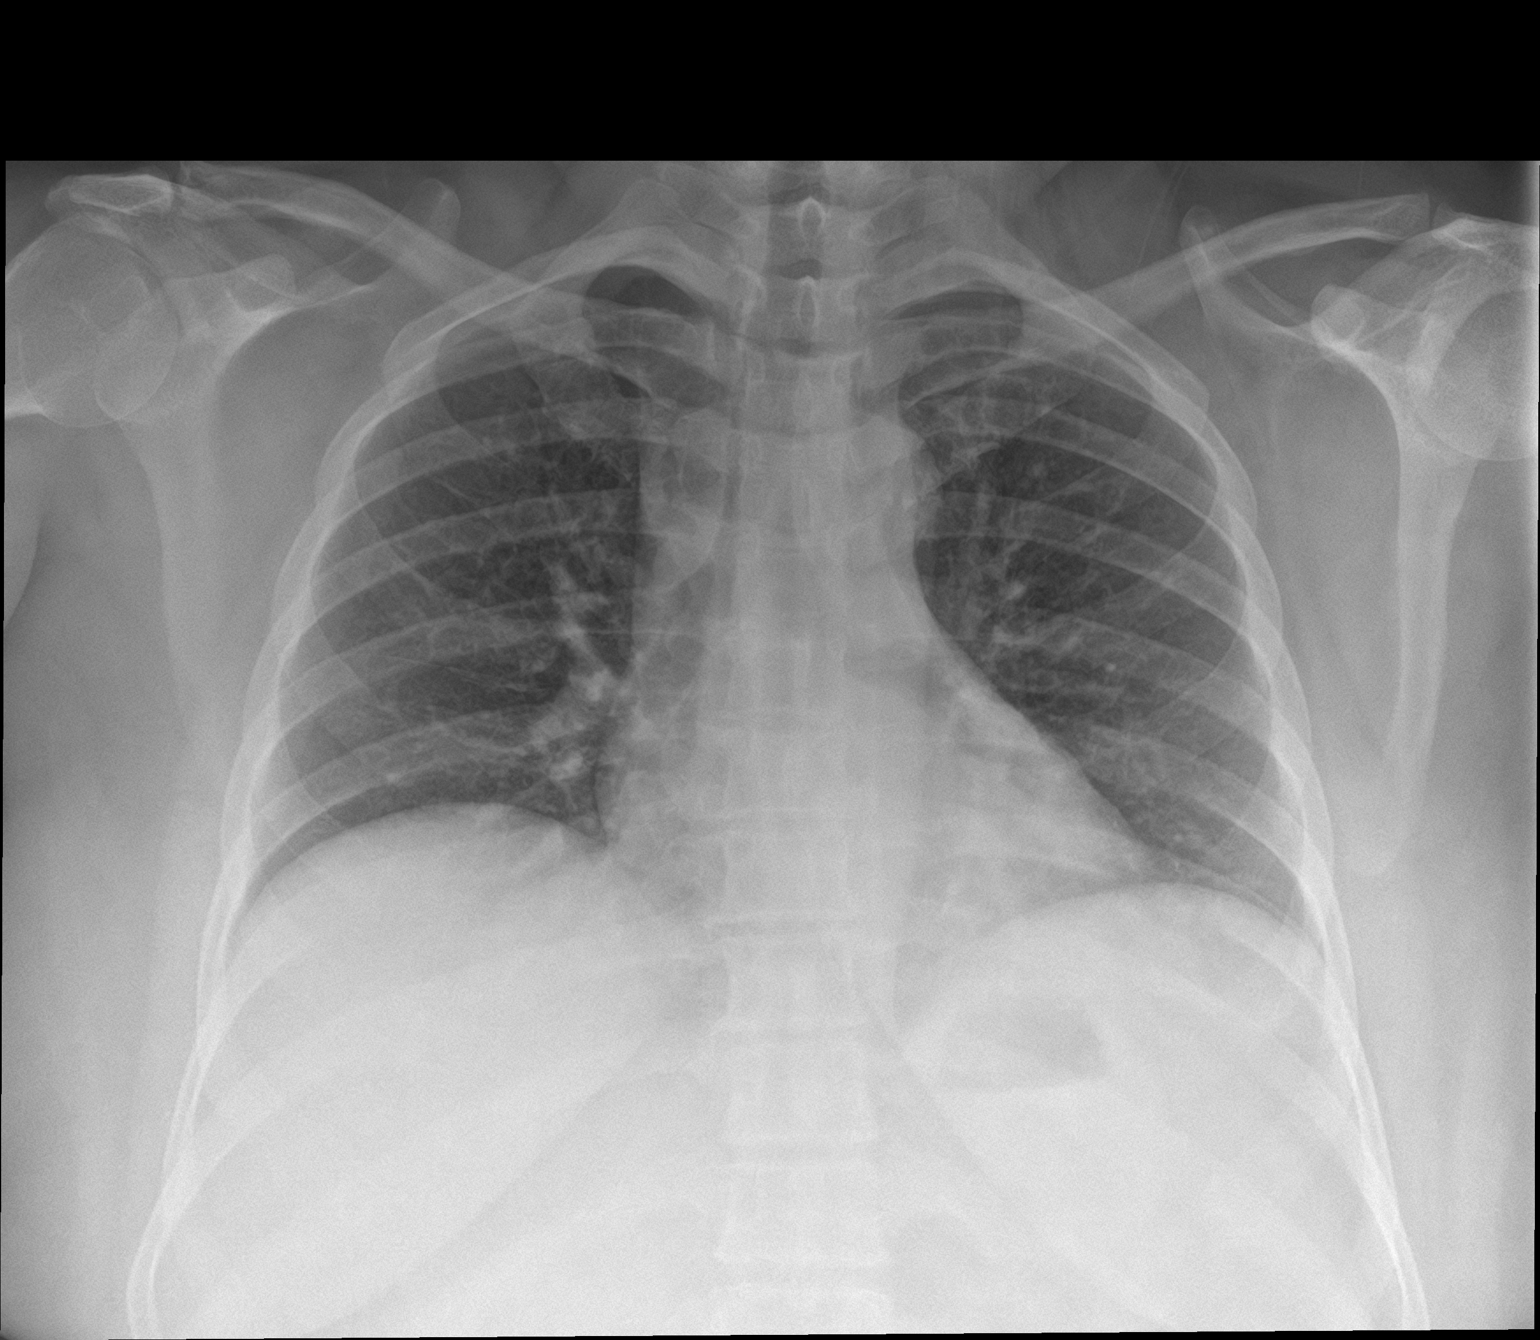

[chest lat]
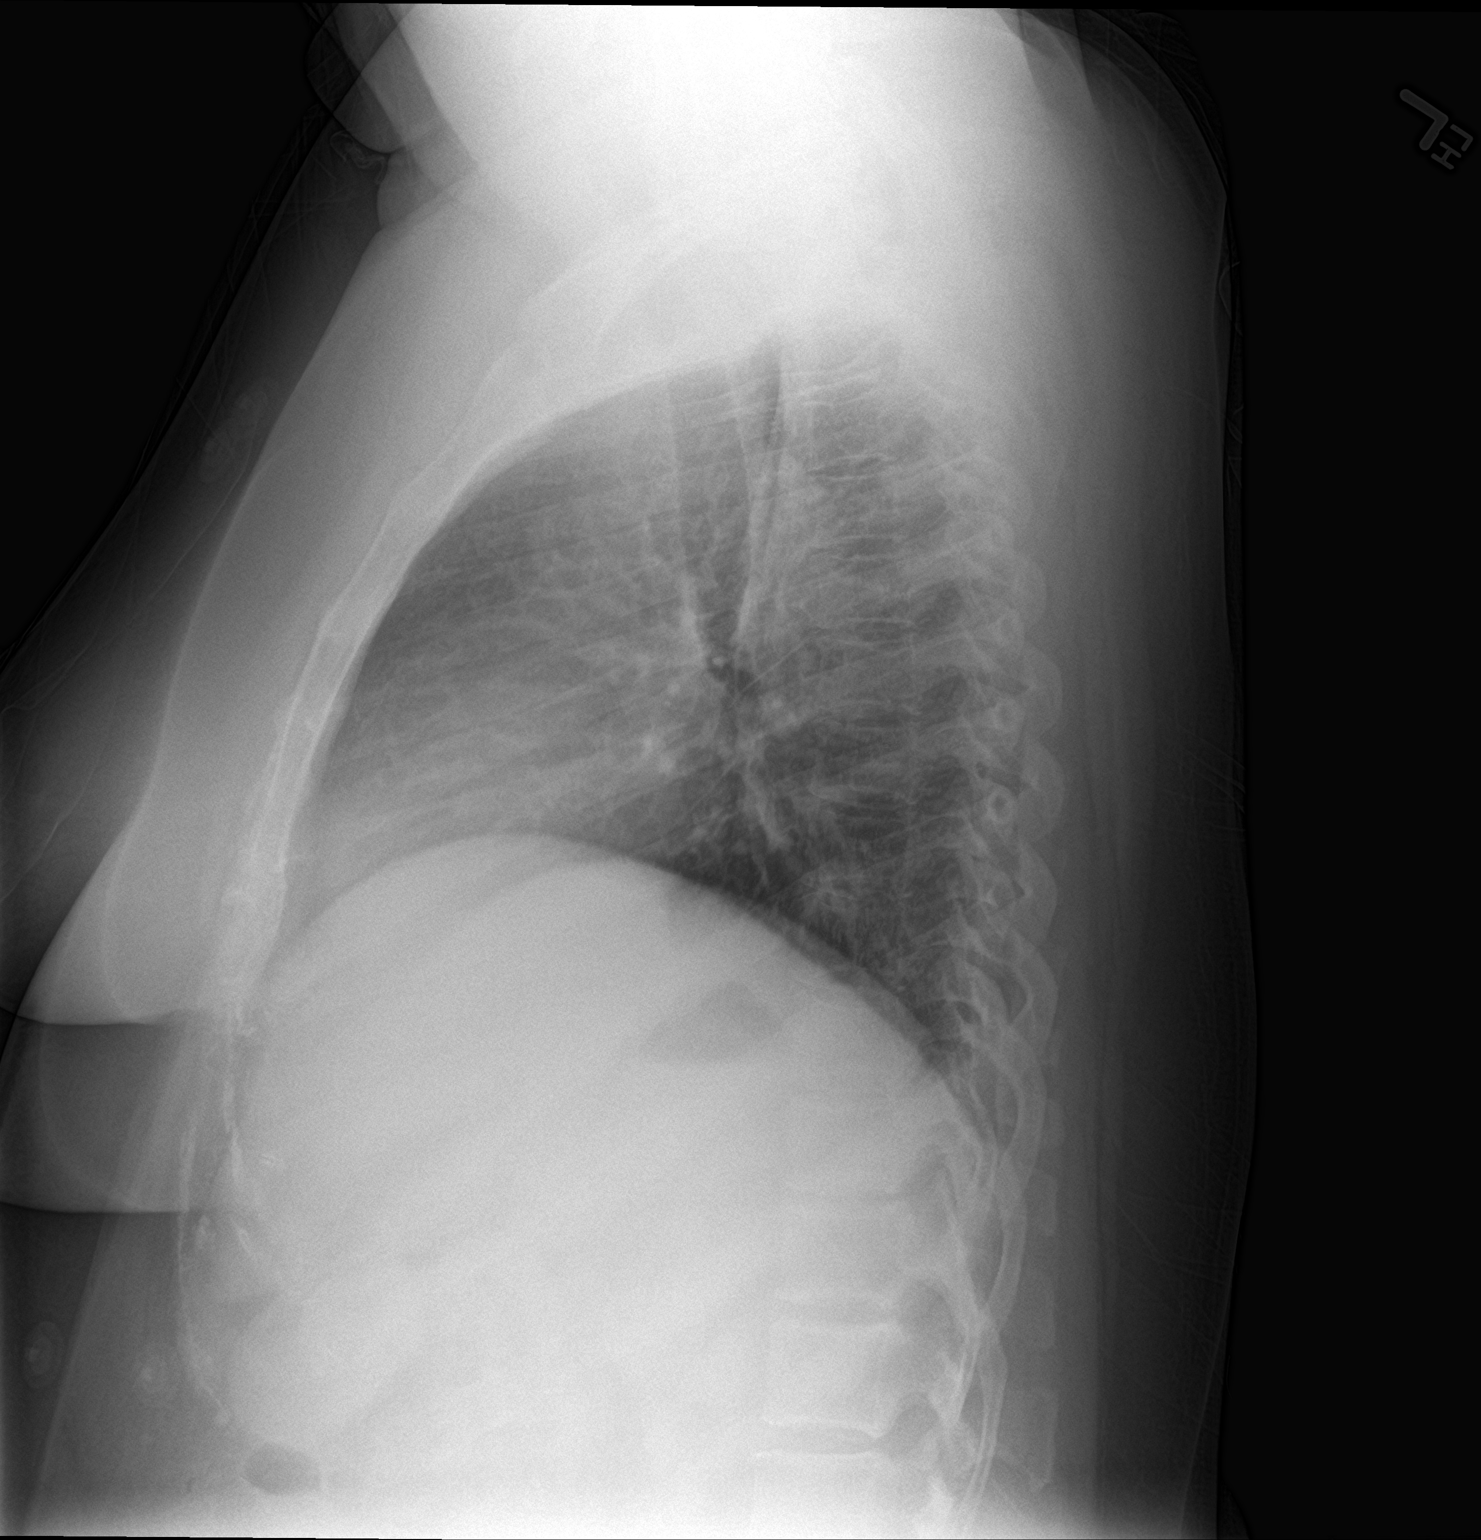

[2 of 2 positions shown; findings below may reference images not displayed]

FINDINGS: Normal heart size, mediastinal contours, and pulmonary vascularity.

Lungs clear.

No pulmonary infiltrate, pleural effusion or pneumothorax.

Osseous structures unremarkable.
IMPRESSION: No acute abnormalities.
